# Patient Record
Sex: Female | Born: 1935 | Race: White | Hispanic: No | Smoking: Never smoker
Health system: Southern US, Community
[De-identification: ages and names within clinical notes are randomized; demographics above are authoritative.]

## PROBLEM LIST (undated history)

## (undated) DIAGNOSIS — I10 Essential (primary) hypertension: Secondary | ICD-10-CM

## (undated) DIAGNOSIS — E119 Type 2 diabetes mellitus without complications: Secondary | ICD-10-CM

## (undated) DIAGNOSIS — G2581 Restless legs syndrome: Secondary | ICD-10-CM

## (undated) DIAGNOSIS — E785 Hyperlipidemia, unspecified: Secondary | ICD-10-CM

## (undated) DIAGNOSIS — Z8719 Personal history of other diseases of the digestive system: Secondary | ICD-10-CM

## (undated) DIAGNOSIS — F329 Major depressive disorder, single episode, unspecified: Secondary | ICD-10-CM

## (undated) DIAGNOSIS — I4891 Unspecified atrial fibrillation: Secondary | ICD-10-CM

## (undated) DIAGNOSIS — J45909 Unspecified asthma, uncomplicated: Secondary | ICD-10-CM

## (undated) DIAGNOSIS — M1990 Unspecified osteoarthritis, unspecified site: Secondary | ICD-10-CM

## (undated) DIAGNOSIS — F32A Depression, unspecified: Secondary | ICD-10-CM

## (undated) DIAGNOSIS — I499 Cardiac arrhythmia, unspecified: Secondary | ICD-10-CM

## (undated) HISTORY — PX: SHOULDER SURGERY: SHX246

## (undated) HISTORY — PX: KNEE ARTHROSCOPY: SUR90

## (undated) HISTORY — PX: BACK SURGERY: SHX140

---

## 2002-04-04 ENCOUNTER — Ambulatory Visit (HOSPITAL_COMMUNITY): Admission: RE | Admit: 2002-04-04 | Discharge: 2002-04-04 | Payer: Self-pay | Admitting: Neurosurgery

## 2002-04-04 ENCOUNTER — Encounter: Payer: Self-pay | Admitting: Neurosurgery

## 2011-05-10 DIAGNOSIS — E119 Type 2 diabetes mellitus without complications: Secondary | ICD-10-CM

## 2014-02-07 DIAGNOSIS — M48062 Spinal stenosis, lumbar region with neurogenic claudication: Secondary | ICD-10-CM | POA: Insufficient documentation

## 2014-02-07 DIAGNOSIS — K219 Gastro-esophageal reflux disease without esophagitis: Secondary | ICD-10-CM | POA: Insufficient documentation

## 2014-02-07 DIAGNOSIS — M5136 Other intervertebral disc degeneration, lumbar region: Secondary | ICD-10-CM | POA: Insufficient documentation

## 2014-02-07 DIAGNOSIS — E785 Hyperlipidemia, unspecified: Secondary | ICD-10-CM | POA: Insufficient documentation

## 2014-02-07 DIAGNOSIS — I10 Essential (primary) hypertension: Secondary | ICD-10-CM | POA: Insufficient documentation

## 2014-09-28 DIAGNOSIS — M069 Rheumatoid arthritis, unspecified: Secondary | ICD-10-CM | POA: Insufficient documentation

## 2015-03-26 DIAGNOSIS — I4891 Unspecified atrial fibrillation: Secondary | ICD-10-CM | POA: Insufficient documentation

## 2015-04-21 DIAGNOSIS — H353212 Exudative age-related macular degeneration, right eye, with inactive choroidal neovascularization: Secondary | ICD-10-CM | POA: Insufficient documentation

## 2015-06-08 DIAGNOSIS — G478 Other sleep disorders: Secondary | ICD-10-CM | POA: Insufficient documentation

## 2015-06-08 DIAGNOSIS — G4733 Obstructive sleep apnea (adult) (pediatric): Secondary | ICD-10-CM

## 2015-06-08 DIAGNOSIS — J4520 Mild intermittent asthma, uncomplicated: Secondary | ICD-10-CM | POA: Insufficient documentation

## 2015-06-08 DIAGNOSIS — Z9989 Dependence on other enabling machines and devices: Secondary | ICD-10-CM

## 2015-06-08 DIAGNOSIS — I2729 Other secondary pulmonary hypertension: Secondary | ICD-10-CM

## 2015-07-11 ENCOUNTER — Emergency Department (HOSPITAL_BASED_OUTPATIENT_CLINIC_OR_DEPARTMENT_OTHER): Payer: Medicare Other

## 2015-07-11 ENCOUNTER — Encounter (HOSPITAL_BASED_OUTPATIENT_CLINIC_OR_DEPARTMENT_OTHER): Payer: Self-pay | Admitting: Emergency Medicine

## 2015-07-11 ENCOUNTER — Emergency Department (HOSPITAL_BASED_OUTPATIENT_CLINIC_OR_DEPARTMENT_OTHER)
Admission: EM | Admit: 2015-07-11 | Discharge: 2015-07-11 | Disposition: A | Discharge status: Home / Self Care | Payer: Medicare Other | Attending: Emergency Medicine | Admitting: Emergency Medicine

## 2015-07-11 DIAGNOSIS — I10 Essential (primary) hypertension: Secondary | ICD-10-CM | POA: Insufficient documentation

## 2015-07-11 DIAGNOSIS — Y939 Activity, unspecified: Secondary | ICD-10-CM | POA: Diagnosis not present

## 2015-07-11 DIAGNOSIS — M25512 Pain in left shoulder: Secondary | ICD-10-CM

## 2015-07-11 DIAGNOSIS — W109XXA Fall (on) (from) unspecified stairs and steps, initial encounter: Secondary | ICD-10-CM | POA: Diagnosis not present

## 2015-07-11 DIAGNOSIS — M6281 Muscle weakness (generalized): Secondary | ICD-10-CM | POA: Diagnosis not present

## 2015-07-11 DIAGNOSIS — Y92009 Unspecified place in unspecified non-institutional (private) residence as the place of occurrence of the external cause: Secondary | ICD-10-CM | POA: Diagnosis not present

## 2015-07-11 DIAGNOSIS — I4891 Unspecified atrial fibrillation: Secondary | ICD-10-CM | POA: Insufficient documentation

## 2015-07-11 DIAGNOSIS — Y999 Unspecified external cause status: Secondary | ICD-10-CM | POA: Diagnosis not present

## 2015-07-11 DIAGNOSIS — G8929 Other chronic pain: Secondary | ICD-10-CM | POA: Insufficient documentation

## 2015-07-11 HISTORY — DX: Unspecified atrial fibrillation: I48.91

## 2015-07-11 HISTORY — DX: Essential (primary) hypertension: I10

## 2015-07-11 LAB — PROTIME-INR
INR: 3.01 — ABNORMAL HIGH (ref 0.00–1.49)
Prothrombin Time: 30.7 seconds — ABNORMAL HIGH (ref 11.6–15.2)

## 2015-07-11 NOTE — ED Provider Notes (Signed)
CSN: [id number]     Arrival date & time 07/11/15  1844 History  By signing my name below, I, Bethel Born, attest that this documentation has been prepared under the direction and in the presence of Melene Plan, DO. Electronically Signed: Bethel Born, ED Scribe. 07/11/2015. 9:34 PM   Chief Complaint  Patient presents with  . Fall   The history is provided by the patient. No language interpreter was used.   Quandra Fedorchak is a 82 y.o. female with history of a-fib on coumadin and HTN who presents to the Emergency Department for evaluation after a fall today at home. The pt states that her legs are weak and gave out on the steps of her deck at home. Her legs are chronically weak with no recent change. She landed on the left shoulder and struck her head with no LOC. Associated symptoms include constant, 5/10 in severity,  left shoulder pain and a small abrasion at the right elbow.  Pt denies neck pain, abdominal pain, pelvic pain, back pain, or LE pain. Last tetanus is unknown. Her INR was last check 2 weeks ago and was 3 at that time.   Past Medical History  Diagnosis Date  . Hypertension   . Atrial fibrillation [facility])    Past Surgical History  Procedure Laterality Date  . Back surgery    . Shoulder surgery    . Knee arthroscopy     History reviewed. No pertinent family history. Social History  Substance Use Topics  . Smoking status: Never Smoker   . Smokeless tobacco: None  . Alcohol Use: No   OB History    No data available     Review of Systems  Musculoskeletal: Positive for arthralgias (left shoulder).  Skin: Positive for wound.  Neurological: Negative for syncope.  All other systems reviewed and are negative.  Allergies  Review of patient's allergies indicates no known allergies.  Home Medications   Prior to Admission medications   Not on File   BP 176/66 mmHg  Pulse 62  Temp(Src) 98.7 F (37.1 C) (Oral)  Resp 19  SpO2 98% Physical Exam  Constitutional: She  is oriented to person, place, and time. She appears well-developed and well-nourished. No distress.  HENT:  Head: Normocephalic and atraumatic.  No signs of trauma to the head   Eyes: EOM are normal. Pupils are equal, round, and reactive to light.  Neck: Normal range of motion. Neck supple.  Cardiovascular: Normal rate and regular rhythm.  Exam reveals no gallop and no friction rub.   No murmur heard. Pulmonary/Chest: Effort normal. She has no wheezes. She has no rales. She exhibits no tenderness.  Abdominal: Soft. She exhibits no distension. There is no tenderness.  Musculoskeletal: She exhibits tenderness. She exhibits no edema.  LUE: Some mild tenderness of supraspinatus and teres minor Pulse, motor, and sensation intact in hand No midline spinal tenderness No tenderness to the lower extremities Grossly NVI  Neurological: She is alert and oriented to person, place, and time.  Skin: Skin is warm and dry. She is not diaphoretic.  Psychiatric: She has a normal mood and affect. Her behavior is normal.  Nursing note and vitals reviewed.   ED Course  Procedures (including critical care time) DIAGNOSTIC STUDIES: Oxygen Saturation is 98% on RA,  normal by my interpretation.    COORDINATION OF CARE: 7:57 PM Discussed treatment plan which includes left shoulder XR, CT head, and lab work with pt at bedside and pt agreed to plan.  Labs Review Labs Reviewed  PROTIME-INR - Abnormal; Notable for the following:    Prothrombin Time 30.7 (*)    INR 3.01 (*)    All other components within normal limits    Imaging Review Ct Head Wo Contrast  07/11/2015  CLINICAL DATA:  Initial evaluation for acute trauma, fall. EXAM: CT HEAD WITHOUT CONTRAST TECHNIQUE: Contiguous axial images were obtained from the base of the skull through the vertex without intravenous contrast. COMPARISON:  None. FINDINGS: Age-related cerebral atrophy with moderate chronic small vessel ischemic disease present. Scattered  vascular calcifications within the carotid siphons. No acute intracranial hemorrhage. No acute large vessel territory infarct. No mass lesion, midline shift, or mass effect. No hydrocephalus. No extra-axial fluid collection. Scalp soft tissues within normal limits. No acute abnormality about the orbits. Paranasal sinuses are clear.  No mastoid effusion. Calvarium intact. IMPRESSION: 1. No acute intracranial process identified. 2. Age-related cerebral atrophy with moderate chronic small vessel ischemic disease. Electronically Signed   By: Rise Mu M.D.   On: 07/11/2015 21:29   Dg Shoulder Left  07/11/2015  CLINICAL DATA:  Fall on the left side with shoulder pain, initial encounter EXAM: LEFT SHOULDER - 2+ VIEW COMPARISON:  None. FINDINGS: Degenerative changes of the acromioclavicular joint are noted. No acute fracture is seen. No gross soft tissue abnormality is noted. IMPRESSION: No acute abnormality noted. Electronically Signed   By: Alcide Clever M.D.   On: 07/11/2015 19:45   I have personally reviewed and evaluated these images and lab results as part of my medical decision-making.   EKG Interpretation None      MDM   Final diagnoses:  Left shoulder pain    82 yo F With a chief complaint of fall. Patient states that she has chronic leg weakness and felt that make her fall forward. She landed on her left shoulder. Having pain mostly to the anterior portion of the shoulder. Unable to fully assess the sits muscles due to pain. We'll place in a sling. PCP follow-up. CT  head negative INR slightly supratherapeutic we'll have her hold 1 dose of Coumadin.  I personally performed the services described in this documentation, which was scribed in my presence. The recorded information has been reviewed and is accurate.   9:37 PM:  I have discussed the diagnosis/risks/treatment options with the patient and family and believe the pt to be eligible for discharge home to follow-up with PCP. We  also discussed returning to the ED immediately if new or worsening sx occur. We discussed the sx which are most concerning (e.g., sudden worsening pain, fever, inability to tolerate by mouth) that necessitate immediate return. Medications administered to the patient during their visit and any new prescriptions provided to the patient are listed below.  Medications given during this visit Medications - No data to display  New Prescriptions   No medications on file    The patient appears reasonably screen and/or stabilized for discharge and I doubt any other medical condition or other [facility] requiring further screening, evaluation, or treatment in the ED at this time prior to discharge.     Melene Plan, DO 07/11/15 2137

## 2015-07-11 NOTE — ED Notes (Signed)
Pt in c/o L shoulder pain after falling on the steps today. States struck her head lightly and does take Coumadin. Alert, interactive, neuro intact at this time.

## 2015-07-11 NOTE — Discharge Instructions (Signed)
Shoulder Pain The shoulder is the joint that connects your arm to your body. Muscles and band-like tissues that connect bones to muscles (tendons) hold the joint together. Shoulder pain is felt if an injury or medical problem affects one or more parts of the shoulder. HOME CARE   Put ice on the sore area.  Put ice in a plastic bag.  Place a towel between your skin and the bag.  Leave the ice on for 15-20 minutes, 03-04 times a day for the first 2 days.  Stop using cold packs if they do not help with the pain.  If you were given something to keep your shoulder from moving (sling; shoulder immobilizer), wear it as told. Only take it off to shower or bathe.  Move your arm as little as possible, but keep your hand moving to prevent puffiness (swelling).  Squeeze a soft ball or foam pad as much as possible to help prevent swelling.  Take medicine as told by your doctor. GET HELP IF:  You have progressing new pain in your arm, hand, or fingers.  Your hand or fingers get cold.  Your medicine does not help lessen your pain. GET HELP RIGHT AWAY IF:   Your arm, hand, or fingers are numb or tingling.  Your arm, hand, or fingers are puffy (swollen), painful, or turn white or blue. MAKE SURE YOU:   Understand these instructions.  Will watch your condition.  Will get help right away if you are not doing well or get worse.   This information is not intended to replace advice given to you by your health care provider. Make sure you discuss any questions you have with your health care provider.   Document Released: 08/02/2007 Document Revised: 03/06/2014 Document Reviewed: 06/08/2014 Elsevier Interactive Patient Education 2016 [employer]  

## 2015-07-30 DIAGNOSIS — Z7901 Long term (current) use of anticoagulants: Secondary | ICD-10-CM

## 2015-09-22 DIAGNOSIS — M1712 Unilateral primary osteoarthritis, left knee: Secondary | ICD-10-CM | POA: Insufficient documentation

## 2015-12-08 DIAGNOSIS — M1711 Unilateral primary osteoarthritis, right knee: Secondary | ICD-10-CM | POA: Insufficient documentation

## 2016-01-13 DIAGNOSIS — Z6841 Body Mass Index (BMI) 40.0 and over, adult: Secondary | ICD-10-CM

## 2016-05-22 DIAGNOSIS — I480 Paroxysmal atrial fibrillation: Secondary | ICD-10-CM | POA: Diagnosis present

## 2016-10-28 HISTORY — PX: HERNIA REPAIR: SHX51

## 2016-11-01 ENCOUNTER — Emergency Department (HOSPITAL_COMMUNITY): Payer: Medicare Other

## 2016-11-01 ENCOUNTER — Encounter (HOSPITAL_COMMUNITY): Payer: Self-pay | Admitting: Emergency Medicine

## 2016-11-01 ENCOUNTER — Observation Stay (HOSPITAL_COMMUNITY)
Admission: EM | Admit: 2016-11-01 | Discharge: 2016-11-02 | Disposition: A | Discharge status: Home / Self Care | Payer: Medicare Other | Attending: Interventional Cardiology | Admitting: Interventional Cardiology

## 2016-11-01 DIAGNOSIS — Z9989 Dependence on other enabling machines and devices: Secondary | ICD-10-CM | POA: Diagnosis not present

## 2016-11-01 DIAGNOSIS — I10 Essential (primary) hypertension: Secondary | ICD-10-CM | POA: Insufficient documentation

## 2016-11-01 DIAGNOSIS — G4733 Obstructive sleep apnea (adult) (pediatric): Secondary | ICD-10-CM | POA: Diagnosis not present

## 2016-11-01 DIAGNOSIS — Z7901 Long term (current) use of anticoagulants: Secondary | ICD-10-CM

## 2016-11-01 DIAGNOSIS — Z6841 Body Mass Index (BMI) 40.0 and over, adult: Secondary | ICD-10-CM | POA: Diagnosis not present

## 2016-11-01 DIAGNOSIS — Z79899 Other long term (current) drug therapy: Secondary | ICD-10-CM | POA: Diagnosis not present

## 2016-11-01 DIAGNOSIS — I480 Paroxysmal atrial fibrillation: Secondary | ICD-10-CM | POA: Diagnosis not present

## 2016-11-01 DIAGNOSIS — I4891 Unspecified atrial fibrillation: Principal | ICD-10-CM | POA: Insufficient documentation

## 2016-11-01 DIAGNOSIS — R079 Chest pain, unspecified: Secondary | ICD-10-CM | POA: Diagnosis present

## 2016-11-01 DIAGNOSIS — E1140 Type 2 diabetes mellitus with diabetic neuropathy, unspecified: Secondary | ICD-10-CM | POA: Diagnosis not present

## 2016-11-01 DIAGNOSIS — E119 Type 2 diabetes mellitus without complications: Secondary | ICD-10-CM

## 2016-11-01 HISTORY — DX: Depression, unspecified: F32.A

## 2016-11-01 HISTORY — DX: Unspecified osteoarthritis, unspecified site: M19.90

## 2016-11-01 HISTORY — DX: Type 2 diabetes mellitus without complications: E11.9

## 2016-11-01 HISTORY — DX: Hyperlipidemia, unspecified: E78.5

## 2016-11-01 HISTORY — DX: Cardiac arrhythmia, unspecified: I49.9

## 2016-11-01 HISTORY — DX: Restless legs syndrome: G25.81

## 2016-11-01 HISTORY — DX: Unspecified asthma, uncomplicated: J45.909

## 2016-11-01 HISTORY — DX: Major depressive disorder, single episode, unspecified: F32.9

## 2016-11-01 LAB — CBC
HEMATOCRIT: 44.4 % (ref 36.0–46.0)
Hemoglobin: 14.5 g/dL (ref 12.0–15.0)
MCH: 30.1 pg (ref 26.0–34.0)
MCHC: 32.7 g/dL (ref 30.0–36.0)
MCV: 92.1 fL (ref 78.0–100.0)
PLATELETS: 247 10*3/uL (ref 150–400)
RBC: 4.82 MIL/uL (ref 3.87–5.11)
RDW: 15 % (ref 11.5–15.5)
WBC: 8.7 10*3/uL (ref 4.0–10.5)

## 2016-11-01 LAB — CBC WITH DIFFERENTIAL/PLATELET
BASOS PCT: 0 %
Basophils Absolute: 0 10*3/uL (ref 0.0–0.1)
EOS ABS: 0.2 10*3/uL (ref 0.0–0.7)
EOS PCT: 3 %
HCT: 47.4 % — ABNORMAL HIGH (ref 36.0–46.0)
HEMOGLOBIN: 15.2 g/dL — AB (ref 12.0–15.0)
Lymphocytes Relative: 15 %
Lymphs Abs: 1.3 10*3/uL (ref 0.7–4.0)
MCH: 29.5 pg (ref 26.0–34.0)
MCHC: 32.1 g/dL (ref 30.0–36.0)
MCV: 91.9 fL (ref 78.0–100.0)
Monocytes Absolute: 1.2 10*3/uL — ABNORMAL HIGH (ref 0.1–1.0)
Monocytes Relative: 13 %
NEUTROS PCT: 69 %
Neutro Abs: 5.9 10*3/uL (ref 1.7–7.7)
PLATELETS: 235 10*3/uL (ref 150–400)
RBC: 5.16 MIL/uL — AB (ref 3.87–5.11)
RDW: 15 % (ref 11.5–15.5)
WBC: 8.7 10*3/uL (ref 4.0–10.5)

## 2016-11-01 LAB — COMPREHENSIVE METABOLIC PANEL
ALBUMIN: 3.6 g/dL (ref 3.5–5.0)
ALK PHOS: 80 U/L (ref 38–126)
ALT: 22 U/L (ref 14–54)
ANION GAP: 13 (ref 5–15)
AST: 29 U/L (ref 15–41)
BILIRUBIN TOTAL: 1.2 mg/dL (ref 0.3–1.2)
BUN: 15 mg/dL (ref 6–20)
CHLORIDE: 107 mmol/L (ref 101–111)
CO2: 20 mmol/L — AB (ref 22–32)
Calcium: 8.8 mg/dL — ABNORMAL LOW (ref 8.9–10.3)
Creatinine, Ser: 0.88 mg/dL (ref 0.44–1.00)
GFR calc Af Amer: >60 mL/min (ref >60)
GFR calc non Af Amer: 59 mL/min — ABNORMAL LOW (ref >60)
GLUCOSE: 123 mg/dL — AB (ref 65–99)
POTASSIUM: 4 mmol/L (ref 3.5–5.1)
Sodium: 140 mmol/L (ref 135–145)
Total Protein: 7.3 g/dL (ref 6.5–8.1)

## 2016-11-01 LAB — BASIC METABOLIC PANEL
ANION GAP: 9 (ref 5–15)
BUN: 20 mg/dL (ref 6–20)
CALCIUM: 8.5 mg/dL — AB (ref 8.9–10.3)
CHLORIDE: 108 mmol/L (ref 101–111)
CO2: 22 mmol/L (ref 22–32)
CREATININE: 0.99 mg/dL (ref 0.44–1.00)
GFR calc Af Amer: 59 mL/min — ABNORMAL LOW (ref >60)
GFR calc non Af Amer: 51 mL/min — ABNORMAL LOW (ref >60)
Glucose, Bld: 123 mg/dL — ABNORMAL HIGH (ref 65–99)
Potassium: 3.7 mmol/L (ref 3.5–5.1)
Sodium: 139 mmol/L (ref 135–145)

## 2016-11-01 LAB — MAGNESIUM
MAGNESIUM: 2.3 mg/dL (ref 1.7–2.4)
Magnesium: 2.1 mg/dL (ref 1.7–2.4)

## 2016-11-01 LAB — PROTIME-INR
INR: 1.68
Prothrombin Time: 19.6 seconds — ABNORMAL HIGH (ref 11.4–15.2)

## 2016-11-01 LAB — HEMOGLOBIN A1C
HEMOGLOBIN A1C: 6.1 % — AB (ref 4.8–5.6)
MEAN PLASMA GLUCOSE: 128.37 mg/dL

## 2016-11-01 LAB — CBG MONITORING, ED
Glucose-Capillary: 115 mg/dL — ABNORMAL HIGH (ref 65–99)
Glucose-Capillary: 143 mg/dL — ABNORMAL HIGH (ref 65–99)

## 2016-11-01 LAB — TSH: TSH: 1.066 u[IU]/mL (ref 0.350–4.500)

## 2016-11-01 LAB — TROPONIN I
TROPONIN I: 0.03 ng/mL — AB (ref <0.03)
TROPONIN I: 0.03 ng/mL — AB (ref <0.03)

## 2016-11-01 LAB — GLUCOSE, CAPILLARY
Glucose-Capillary: 107 mg/dL — ABNORMAL HIGH (ref 65–99)
Glucose-Capillary: 141 mg/dL — ABNORMAL HIGH (ref 65–99)

## 2016-11-01 LAB — BRAIN NATRIURETIC PEPTIDE: B Natriuretic Peptide: 322.2 pg/mL — ABNORMAL HIGH (ref 0.0–100.0)

## 2016-11-01 MED ORDER — FOLIC ACID 1 MG PO TABS
1.0000 mg | ORAL_TABLET | Freq: Every day | ORAL | Status: DC
  Administered 2016-11-01 – 2016-11-02 (×2): 1 mg via ORAL
  Filled 2016-11-01 (×2): qty 1

## 2016-11-01 MED ORDER — MOMETASONE FURO-FORMOTEROL FUM 100-5 MCG/ACT IN AERO
2.0000 | INHALATION_SPRAY | Freq: Two times a day (BID) | RESPIRATORY_TRACT | Status: DC
  Administered 2016-11-01 – 2016-11-02 (×2): 2 via RESPIRATORY_TRACT
  Filled 2016-11-01: qty 8.8

## 2016-11-01 MED ORDER — AMIODARONE HCL IN DEXTROSE 360-4.14 MG/200ML-% IV SOLN: 30.0000 mg/h | INTRAVENOUS | Status: DC

## 2016-11-01 MED ORDER — DILTIAZEM HCL 100 MG IV SOLR
5.0000 mg/h | INTRAVENOUS | Status: DC
  Administered 2016-11-01 (×2): 5 mg/h via INTRAVENOUS
  Filled 2016-11-01 (×2): qty 100

## 2016-11-01 MED ORDER — ALBUTEROL SULFATE (2.5 MG/3ML) 0.083% IN NEBU: 2.5000 mg | INHALATION_SOLUTION | Freq: Four times a day (QID) | RESPIRATORY_TRACT | Status: DC | PRN

## 2016-11-01 MED ORDER — ATENOLOL 25 MG PO TABS
100.0000 mg | ORAL_TABLET | Freq: Every day | ORAL | Status: DC
  Administered 2016-11-01 – 2016-11-02 (×2): 100 mg via ORAL
  Filled 2016-11-01: qty 4
  Filled 2016-11-01: qty 2

## 2016-11-01 MED ORDER — METOPROLOL TARTRATE 5 MG/5ML IV SOLN: 5.0000 mg | INTRAVENOUS | Status: DC | PRN

## 2016-11-01 MED ORDER — ONDANSETRON HCL 4 MG/2ML IJ SOLN: 4.0000 mg | Freq: Once | INTRAMUSCULAR | Status: DC

## 2016-11-01 MED ORDER — WARFARIN SODIUM 4 MG PO TABS
4.0000 mg | ORAL_TABLET | Freq: Once | ORAL | Status: AC
  Administered 2016-11-01: 4 mg via ORAL
  Filled 2016-11-01: qty 1

## 2016-11-01 MED ORDER — ROPINIROLE HCL 0.5 MG PO TABS
0.5000 mg | ORAL_TABLET | Freq: Every day | ORAL | Status: DC
  Administered 2016-11-01: 0.5 mg via ORAL
  Filled 2016-11-01: qty 1

## 2016-11-01 MED ORDER — AMIODARONE LOAD VIA INFUSION
150.0000 mg | Freq: Once | INTRAVENOUS | Status: DC
  Filled 2016-11-01: qty 83.34

## 2016-11-01 MED ORDER — ATORVASTATIN CALCIUM 20 MG PO TABS
20.0000 mg | ORAL_TABLET | Freq: Every evening | ORAL | Status: DC
  Administered 2016-11-01: 20 mg via ORAL
  Filled 2016-11-01: qty 1

## 2016-11-01 MED ORDER — PANTOPRAZOLE SODIUM 40 MG PO TBEC
40.0000 mg | DELAYED_RELEASE_TABLET | Freq: Every day | ORAL | Status: DC
  Administered 2016-11-01 – 2016-11-02 (×2): 40 mg via ORAL
  Filled 2016-11-01 (×2): qty 1

## 2016-11-01 MED ORDER — ONDANSETRON HCL 4 MG/2ML IJ SOLN: 4.0000 mg | Freq: Four times a day (QID) | INTRAMUSCULAR | Status: DC | PRN

## 2016-11-01 MED ORDER — AMIODARONE HCL IN DEXTROSE 360-4.14 MG/200ML-% IV SOLN
60.0000 mg/h | INTRAVENOUS | Status: DC
Start: 2016-11-01 — End: 2016-11-01
  Filled 2016-11-01: qty 200

## 2016-11-01 MED ORDER — BUDESONIDE-FORMOTEROL FUMARATE 80-4.5 MCG/ACT IN AERO
2.0000 | INHALATION_SPRAY | Freq: Two times a day (BID) | RESPIRATORY_TRACT | Status: DC
  Filled 2016-11-01: qty 6.9

## 2016-11-01 MED ORDER — ACETAMINOPHEN 325 MG PO TABS: 650.0000 mg | ORAL_TABLET | ORAL | Status: DC | PRN

## 2016-11-01 MED ORDER — MORPHINE SULFATE (PF) 4 MG/ML IV SOLN: 4.0000 mg | Freq: Once | INTRAVENOUS | Status: DC

## 2016-11-01 MED ORDER — WARFARIN - PHARMACIST DOSING INPATIENT: Freq: Every day | Status: DC

## 2016-11-01 MED ORDER — ALBUTEROL SULFATE HFA 108 (90 BASE) MCG/ACT IN AERS: 1.0000 | INHALATION_SPRAY | Freq: Four times a day (QID) | RESPIRATORY_TRACT | Status: DC | PRN

## 2016-11-01 MED ORDER — METHOTREXATE SODIUM CHEMO INJECTION 50 MG/2ML: 10.0000 mg | INTRAMUSCULAR | Status: DC

## 2016-11-01 MED ORDER — INSULIN ASPART 100 UNIT/ML ~~LOC~~ SOLN
0.0000 [IU] | Freq: Three times a day (TID) | SUBCUTANEOUS | Status: DC
Start: 2016-11-01 — End: 2016-11-02
  Administered 2016-11-01 – 2016-11-02 (×2): 1 [IU] via SUBCUTANEOUS
  Filled 2016-11-01: qty 1

## 2016-11-01 MED ORDER — PREDNISONE 5 MG PO TABS
5.0000 mg | ORAL_TABLET | Freq: Every day | ORAL | Status: DC
  Administered 2016-11-02: 5 mg via ORAL
  Filled 2016-11-01: qty 1

## 2016-11-01 MED ORDER — DILTIAZEM LOAD VIA INFUSION
20.0000 mg | Freq: Once | INTRAVENOUS | Status: AC
  Administered 2016-11-01: 20 mg via INTRAVENOUS
  Filled 2016-11-01: qty 20

## 2016-11-01 NOTE — Progress Notes (Signed)
ANTICOAGULATION CONSULT NOTE - Initial Consult  Pharmacy Consult for Coumadin  Indication: atrial fibrillation  Allergies  Allergen Reactions  . Other Itching and Rash    Antibiotic but she is not sure which one.  Maybe starts with a D for UTI     Patient Measurements: Height: 5\' 2"  (157.5 cm) Weight: 243 lb (110.2 kg) IBW/kg (Calculated) : 50.1 Heparin Dosing Weight: n/a   Vital Signs: Temp: 97.7 F (36.5 C) (09/05 1700) Temp Source: Oral (09/05 1700) BP: 152/98 (09/05 1545) Pulse Rate: 64 (09/05 1630)  Labs:  Recent Labs  11/01/16 0530 11/01/16 1408  HGB 14.5 15.2*  HCT 44.4 47.4*  PLT 247 235  LABPROT  --  19.6*  INR  --  1.68  CREATININE 0.99 0.88  TROPONINI  --  0.03*    Estimated Creatinine Clearance: 55.7 mL/min (by C-G formula based on SCr of 0.88 mg/dL).   Medical History: Past Medical History:  Diagnosis Date  . Arrhythmia   . Arthritis   . Asthma   . Atrial fibrillation (HCC)   . Depression   . Diabetes mellitus without complication (HCC)   . Hyperlipidemia   . Hypertension   . RLS (restless legs syndrome)     Medications:  Prescriptions Prior to Admission  Medication Sig Dispense Refill Last Dose  . albuterol (PROVENTIL HFA;VENTOLIN HFA) 108 (90 Base) MCG/ACT inhaler Inhale 1-2 puffs into the lungs every 6 (six) hours as needed for wheezing or shortness of breath.   unk  . atorvastatin (LIPITOR) 20 MG tablet Take 20 mg by mouth every evening.   10/31/2016 at Unknown time  . Budesonide-Formoterol Fumarate (SYMBICORT IN) Inhale 1-2 puffs into the lungs daily as needed (shortness of breath).   unk  . diltiazem (CARDIZEM) 30 MG tablet Take 30 mg by mouth 2 (two) times daily.   10/31/2016 at Unknown time  . folic acid (FOLVITE) 1 MG tablet Take 1 mg by mouth daily.   10/31/2016 at Unknown time  . [START ON 11/04/2016] methotrexate 50 MG/2ML injection Inject 10 mg into the skin once a week.   10/28/2016  . omeprazole (PRILOSEC) 20 MG capsule Take 20 mg by  mouth daily.   10/31/2016 at Unknown time  . rOPINIRole (REQUIP) 0.25 MG tablet Take 0.5 mg by mouth at bedtime.   10/31/2016 at Unknown time  . warfarin (COUMADIN) 2.5 MG tablet Take 2.5 mg by mouth daily.   10/31/2016 at 2400    Assessment: 44 YOF with h/o pAFib with RVR here with palpitations, weakness and dyspnea. Pharmacy consulted to resume home Coumadin for AFib. INR on admission is sub-therapeutic at 1.68. H/H 15.2/47.2. Plt wnl.   Home Coumadin dose: 2.5 mg daily. Last dose was yesterday.   Goal of Therapy:  INR 2-3 Monitor platelets by anticoagulation protocol: Yes   Plan:  -Give a boost dose of warfarin 4 mg x 1 today -Monitor daily PT/INR and s/s of bleeding  05-04-2000, PharmD., BCPS Clinical Pharmacist Pager [phone]

## 2016-11-01 NOTE — ED Notes (Signed)
Attempted to start an IV x 1 unsuccessfully

## 2016-11-01 NOTE — H&P (Addendum)
Cardiology Admission History and Physical:   Patient ID: Michelle Douglas; MRN: [id number]; DOB: [birth year]   Admission date: 11/01/2016  Primary Care Provider: Ardyth Gal, MD Primary Cardiologist: Sandy Salaam Primary Electrophysiologist:  None  Chief Complaint:  AF with RVR  Patient Profile:   Michelle Douglas is a 83 y.o. female with a history of Paroxysmal atrial fibrillation with rapid ventricular response and other medical problems including obstructive sleep apnea, diabetes mellitus type 2, hyperlipidemia, hypertension, chronic Coumadin anticoagulation therapy, degenerative arthritis, rheumatoid arthritis, chronic steroid use, hiatal hernia, restless leg syndrome, and depression. The current episode of atrial fibrillation started at around 3 AM.  History of Present Illness:   Michelle Douglas was unable to sleep last night and while up at around 3 AM developed palpitations, weakness, and dyspnea. She has a history of paroxysmal atrial fibrillation and the symptoms were compatible with her prior presentations. She did not have syncope. She is asymptomatic when sitting or lying. She was brought to the [facility] emergency room because they were full at [facility]. She is managed by Dr. Sandy Salaam in [facility]. As best I can tell, cardiac medications including atenolol, diltiazem, and Coumadin. There is no history of stroke. History of atrial fibrillation and chronic Coumadin therapy date back approximately 3 years.   Past Medical History:  Diagnosis Date  . Arrhythmia   . Arthritis   . Asthma   . Atrial fibrillation (HCC)   . Depression   . Diabetes mellitus without complication (HCC)   . Hyperlipidemia   . Hypertension   . RLS (restless legs syndrome)     Past Surgical History:  Procedure Laterality Date  . BACK SURGERY    . KNEE ARTHROSCOPY    . SHOULDER SURGERY       Medications Prior to Admission: Prior to Admission medications   Medication Sig Start  Date End Date Taking? Authorizing Provider  albuterol (PROVENTIL HFA;VENTOLIN HFA) 108 (90 Base) MCG/ACT inhaler Inhale 1-2 puffs into the lungs every 6 (six) hours as needed for wheezing or shortness of breath.   Yes [provider]  atorvastatin (LIPITOR) 20 MG tablet Take 20 mg by mouth every evening.   Yes [provider]  Budesonide-Formoterol Fumarate (SYMBICORT IN) Inhale 1-2 puffs into the lungs daily as needed (shortness of breath).   Yes [provider]  diltiazem (CARDIZEM) 30 MG tablet Take 30 mg by mouth 2 (two) times daily.   Yes [provider]  folic acid (FOLVITE) 1 MG tablet Take 1 mg by mouth daily.   Yes [provider]  methotrexate (50 MG/ML) 1 g injection Inject 2 mg into the vein once a week. saturday   Yes [provider]  omeprazole (PRILOSEC) 20 MG capsule Take 20 mg by mouth daily.   Yes [provider]  rOPINIRole (REQUIP) 0.25 MG tablet Take 0.5 mg by mouth at bedtime.   Yes [provider]  warfarin (COUMADIN) 2.5 MG tablet Take 2.5 mg by mouth daily.   Yes [provider]     Allergies:    Allergies  Allergen Reactions  . Other Itching and Rash    Antibiotic but she is not sure which one.  Maybe starts with a D for UTI     Social History:   Social History   Social History  . Marital status: Married    Spouse name: N/A  . Number of children: N/A  . Years of education: N/A   Occupational  History  . Not on file.   Social History Main Topics  . Smoking status: Never Smoker  . Smokeless tobacco: Never Used  . Alcohol use No  . Drug use: No  . Sexual activity: No   Other Topics Concern  . Not on file   Social History Narrative  . No narrative on file    Family History:  The patient's mother and father are deceased. There is no history relevant to her current presenting complaints. The patient's family history is not on file.    ROS:  Please see the history of  present illness.  Severe insomnia with restless leg syndrome. Unable to sleep most nights. Chronic back discomfort and leg discomfort with walking. Significant reflux related symptoms with upcoming surgery for hiatal hernia being planned. All other ROS reviewed and negative.     Physical Exam/Data:   Vitals:   11/01/16 0730 11/01/16 0745 11/01/16 0800 11/01/16 0815  BP: 122/78 119/81 115/71 136/74  Pulse: 67 97 76 95  Resp: (!) 21 20 19 18   Temp:      TempSrc:      SpO2: 95% 98% 94% 95%   No intake or output data in the 24 hours ending 11/01/16 0830 There were no vitals filed for this visit. There is no height or weight on file to calculate BMI.  General:  Well nourished, well developed, in no acute distress. Elderly. HEENT: normal Lymph: no adenopathy Neck: no JVD Endocrine:  No thryomegaly Vascular: No carotid bruits; FA pulses 2+ bilaterally without bruits  Cardiac:  normal S1, S2; IIRR; no murmur or gallop. Lungs:  clear to auscultation bilaterally, no wheezing, rhonchi or rales  Abd: soft, nontender, no hepatomegaly  Ext: no edema Musculoskeletal:  No deformities, BUE and BLE strength normal and equal Skin: warm and dry  Neuro:  CNs 2-12 intact, no focal abnormalities noted Psych:  Normal affect    EKG:  The ECG that was done 11/01/2016 was personally reviewed and demonstrates atrial fibrillation with rapid ventricular response at 126 bpm without acute ST-T wave change. Leftward axis. No old tracing to compare.  Relevant CV Studies: Echocardiogram 01/03/2016 in [facility] Conclusions Summary Mild concentric left ventricular hypertrophy Normal left ventricular size and systolic function with no appreciable segmental abnormality. Ejection fraction is visually estimated at 55-60% Mild tricuspid regurgitation RVSP 30 mm Hg. Laboratory Data:  Chemistry  Recent Labs Lab 11/01/16 0530  NA 139  K 3.7  CL 108  CO2 22  GLUCOSE 123*  BUN 20  CREATININE 0.99  CALCIUM  8.5*  GFRNONAA 51*  GFRAA 59*  ANIONGAP 9    No results for input(s): PROT, ALBUMIN, AST, ALT, ALKPHOS, BILITOT in the last 168 hours. Hematology  Recent Labs Lab 11/01/16 0530  WBC 8.7  RBC 4.82  HGB 14.5  HCT 44.4  MCV 92.1  MCH 30.1  MCHC 32.7  RDW 15.0  PLT 247   Cardiac EnzymesNo results for input(s): TROPONINI in the last 168 hours. No results for input(s): TROPIPOC in the last 168 hours.  BNPNo results for input(s): BNP, PROBNP in the last 168 hours.  DDimer No results for input(s): DDIMER in the last 168 hours.  Radiology/Studies:  Dg Chest Port 1 View  Result Date: 11/01/2016 CLINICAL DATA:  Atrial fibrillation and shortness of breath tonight. EXAM: PORTABLE CHEST 1 VIEW COMPARISON:  01/02/2016 FINDINGS: Cardiac enlargement without pulmonary vascular congestion. No airspace disease or consolidation in the lungs. No blunting of costophrenic angles. No pneumothorax.  Calcification of the aorta. Moderate esophageal hiatal hernia behind the heart. Degenerative changes in the shoulders. IMPRESSION: Cardiac enlargement.  No evidence of active pulmonary disease. Electronically Signed   By: Burman Nieves M.D.   On: 11/01/2016 06:17    Assessment and Plan:   1. Paroxysmal atrial fibrillation with rapid ventricular response, currently on IV diltiazem with slowing of rate. Usual home medications include diltiazem 30 mg 3 times a day and atenolol 100 mg daily. She is also on chronic Coumadin anticoagulation. Will continue atenolol, IV diltiazem, and give IV amiodarone to help facilitate conversion. She has had multiple prior admissions for atrial fibrillation but has never required electrical cardioversion. CHADS VASC 5 2. Chronic anticoagulation with Coumadin. Current INR is 3.01. We'll have pharmacy help manage anticoagulation while hospitalized. CHADS VASC 5 3. Hypertension with controlled blood pressure currently. 4. Rheumatoid arthritis with corticosteroid dependence. Will  continue maintenance dose of prednisone. 5. History of asthma. 6. Obstructive sleep apnea requiring C Pap 7. Type 2 diabetes mellitus, with unknown glycemic control.  Severity of Illness: The appropriate patient status for this patient is INPATIENT. Inpatient status is judged to be reasonable and necessary in order to provide the required intensity of service to ensure the patient's safety. The patient's presenting symptoms, physical exam findings, and initial radiographic and laboratory data in the context of their chronic comorbidities is felt to place them at high risk for further clinical deterioration. Furthermore, it is not anticipated that the patient will be medically stable for discharge from the hospital within 2 midnights of admission. The following factors support the patient status of inpatient.   " The patient's presenting symptoms include dyspnea and weakness. " The worrisome physical exam findings include severe tachycardia. " The initial radiographic and laboratory data are worrisome because of cardiac enlargement. " The chronic co-morbidities include diabetes mellitus, obstructive sleep apnea, and asthma..   * I certify that at the point of admission it is my clinical judgment that the patient will require inpatient hospital care spanning beyond 2 midnights from the point of admission due to high intensity of service, high risk for further deterioration and high frequency of surveillance required.*    Signed, Lesleigh Noe, MD  11/01/2016 8:30 AM

## 2016-11-01 NOTE — ED Notes (Signed)
Assisted patient with use of bedpan.

## 2016-11-01 NOTE — ED Provider Notes (Signed)
[facility]EMER[facility]ENCY DEPT Provider Note   CSN: [id number] Arrival date & time: 11/01/16  4656     History   Chief Complaint Chief Complaint  Patient presents with  . Atrial Fibrillation  . Chest Pain    HPI Michelle Douglas is a 83 y.o. female.  HPI  Patient presents with palpitations, mild chest discomfort. Symptoms awoke her from sleep approximately 2 hours prior to ED arrival. Patient notes a history of atrial fibrillation, takes all medication as directed, has had no recent medication changes. Aside from 2 episodes of diaphoresis of the past 2 days, which were nonsustained, with no palpitations, she has been doing generally well. She does note prior episodes of atrial fibrillation with tachycardia in spite of taking all medication. Typically these improved after ospital treatment with medication.   I discussed the patient's presentation with EMS.    Past Medical History:  Diagnosis Date  . Arthritis   . Atrial fibrillation (HCC)   . Depression   . Hypertension   . RLS (restless legs syndrome)     There are no active problems to display for this patient.   Past Surgical History:  Procedure Laterality Date  . BACK SURGERY    . KNEE ARTHROSCOPY    . SHOULDER SURGERY      OB History    No data available       Home Medications    Prior to Admission medications   Medication Sig Start Date End Date Taking? Authorizing Provider  albuterol (PROVENTIL HFA;VENTOLIN HFA) 108 (90 Base) MCG/ACT inhaler Inhale 1-2 puffs into the lungs every 6 (six) hours as needed for wheezing or shortness of breath.   Yes [provider]  atorvastatin (LIPITOR) 20 MG tablet Take 20 mg by mouth every evening.   Yes [provider]  Budesonide-Formoterol Fumarate (SYMBICORT IN) Inhale 1-2 puffs into the lungs daily as needed (shortness of breath).   Yes [provider]  diltiazem (CARDIZEM) 30 MG tablet Take 30 mg by mouth 2 (two) times daily.   Yes [provider]  folic acid (FOLVITE) 1 MG tablet Take 1 mg by mouth daily.   Yes [provider]  methotrexate (50 MG/ML) 1 g injection Inject 2 mg into the vein once a week. saturday   Yes [provider]  omeprazole (PRILOSEC) 20 MG capsule Take 20 mg by mouth daily.   Yes [provider]  rOPINIRole (REQUIP) 0.25 MG tablet Take 0.5 mg by mouth at bedtime.   Yes [provider]  warfarin (COUMADIN) 2.5 MG tablet Take 2.5 mg by mouth daily.   Yes [provider]    Family History No family history on file.  Social History Social History  Substance Use Topics  . Smoking status: Never Smoker  . Smokeless tobacco: Not on file  . Alcohol use No     Allergies   Other   Review of Systems Review of Systems  Constitutional:       Per HPI, otherwise negative  HENT:       Per HPI, otherwise negative  Respiratory:       Per HPI, otherwise negative  Cardiovascular:       Per HPI, otherwise negative  Gastrointestinal: Negative for vomiting.  Endocrine:       Negative aside from HPI  Genitourinary:       Neg aside from HPI   Musculoskeletal:       Per HPI, otherwise negative  Skin: Negative.   Neurological:  Negative for syncope.     Physical Exam Updated Vital Signs BP 121/84   Pulse 90   Temp 98.7 F (37.1 C) (Oral)   Resp (!) 24   SpO2 95%   Physical Exam  Constitutional: She is oriented to person, place, and time. She appears well-developed and well-nourished. No distress.  HENT:  Head: Normocephalic and atraumatic.  Eyes: Conjunctivae and EOM are normal.  Cardiovascular: An irregularly irregular rhythm present. Tachycardia present.   Pulmonary/Chest: Effort normal and breath sounds normal. No stridor. No respiratory distress.  Abdominal: She exhibits no distension.  Musculoskeletal: She exhibits no edema.  Neurological: She is alert and oriented to person, place, and time. No cranial nerve deficit.  Skin: Skin is  warm and dry.  Psychiatric: She has a normal mood and affect.  Nursing note and vitals reviewed.    ED Treatments / Results  Labs (all labs ordered are listed, but only abnormal results are displayed) Labs Reviewed  BASIC METABOLIC PANEL - Abnormal; Notable for the following:       Result Value   Glucose, Bld 123 (*)    Calcium 8.5 (*)    GFR calc non Af Amer 51 (*)    GFR calc Af Amer 59 (*)    All other components within normal limits  MAGNESIUM  CBC    EKG  EKG Interpretation  Date/Time:  Wednesday November 01 2016 05:21:41 EDT Ventricular Rate:  126 PR Interval:    QRS Duration: 86 QT Interval:  322 QTC Calculation: 438 R Axis:   -2 Text Interpretation:  Atrial fibrillation Abnormal ekg Confirmed by Gerhard Munch [phone]) on 11/01/2016 6:13:37 AM       Radiology Dg Chest Port 1 View  Result Date: 11/01/2016 CLINICAL DATA:  Atrial fibrillation and shortness of breath tonight. EXAM: PORTABLE CHEST 1 VIEW COMPARISON:  01/02/2016 FINDINGS: Cardiac enlargement without pulmonary vascular congestion. No airspace disease or consolidation in the lungs. No blunting of costophrenic angles. No pneumothorax. Calcification of the aorta. Moderate esophageal hiatal hernia behind the heart. Degenerative changes in the shoulders. IMPRESSION: Cardiac enlargement.  No evidence of active pulmonary disease. Electronically Signed   By: Burman Nieves M.D.   On: 11/01/2016 06:17    Procedures Procedures (including critical care time)  Medications Ordered in ED Medications  diltiazem (CARDIZEM) 1 mg/mL load via infusion 20 mg (20 mg Intravenous Bolus from Bag 11/01/16 0602)    And  diltiazem (CARDIZEM) 100 mg in dextrose 5 % 100 mL (1 mg/mL) infusion (5 mg/hr Intravenous New Bag/Given 11/01/16 0602)     Initial Impression / Assessment and Plan / ED Course  I have reviewed the triage vital signs and the nursing notes.  Pertinent labs & imaging results that were available during my care  of the patient were reviewed by me and considered in my medical decision making (see chart for details).   After the initial evaluation, given the patient's history of atrial fibrillation, she started on a Cardizem drip after receiving a bolus dose of 20 mg per   6:26 AM Patient has diminished tachycardia, but remains in atrial fibrillation, she is receiving Cardizem, without new complications. Initial labs were reassuring, x-ray are reassuring.  Given the patient's return to atrial fibrillation, in spite of taking all medication as directed, I will discuss her case with our cardiology colleagues for further evaluation and management.  Final Clinical Impressions(s) / ED Diagnoses  AFIB with rapid ventricular response.  CRITICAL CARE Performed by: Gerhard Munch Total  critical care time: 35 minutes Critical care time was exclusive of separately billable procedures and treating other patients. Critical care was necessary to treat or prevent imminent or life-threatening deterioration. Critical care was time spent personally by me on the following activities: development of treatment plan with patient and/or surrogate as well as nursing, discussions with consultants, evaluation of patient's response to treatment, examination of patient, obtaining history from patient or surrogate, ordering and performing treatments and interventions, ordering and review of laboratory studies, ordering and review of radiographic studies, pulse oximetry and re-evaluation of patient's condition.    Gerhard Munch, MD 11/01/16 [phone]

## 2016-11-01 NOTE — ED Notes (Signed)
Cardiology updated about pt being in NSR at present; no amiodarone to be given at present

## 2016-11-01 NOTE — ED Triage Notes (Signed)
Pt reports Monday while volunteering she became diaphoretic and dizziness. Continual off and on diaphoresis with dizziness since Monday. Last night was unable to sleep, walking around and began experiencing chest pressure 4/10. Given 324 Asprin and 2 nitro without relief to pain. Afib upon arrival ranging from 115-140. 20G PIV placed to LAC.

## 2016-11-01 NOTE — ED Notes (Signed)
Checked patient cbg it was 35 notified RN of blood sugar patient is resting with call bell in reach

## 2016-11-01 NOTE — ED Notes (Signed)
X-ray at bedside

## 2016-11-01 NOTE — ED Notes (Signed)
Attempted report 

## 2016-11-02 ENCOUNTER — Inpatient Hospital Stay (HOSPITAL_COMMUNITY): Payer: Medicare Other

## 2016-11-02 DIAGNOSIS — I10 Essential (primary) hypertension: Secondary | ICD-10-CM | POA: Diagnosis not present

## 2016-11-02 DIAGNOSIS — Z6841 Body Mass Index (BMI) 40.0 and over, adult: Secondary | ICD-10-CM

## 2016-11-02 DIAGNOSIS — Z9989 Dependence on other enabling machines and devices: Secondary | ICD-10-CM

## 2016-11-02 DIAGNOSIS — Z7901 Long term (current) use of anticoagulants: Secondary | ICD-10-CM | POA: Diagnosis not present

## 2016-11-02 DIAGNOSIS — I4891 Unspecified atrial fibrillation: Secondary | ICD-10-CM | POA: Diagnosis not present

## 2016-11-02 DIAGNOSIS — I340 Nonrheumatic mitral (valve) insufficiency: Secondary | ICD-10-CM

## 2016-11-02 DIAGNOSIS — Z79899 Other long term (current) drug therapy: Secondary | ICD-10-CM | POA: Diagnosis not present

## 2016-11-02 DIAGNOSIS — G4733 Obstructive sleep apnea (adult) (pediatric): Secondary | ICD-10-CM

## 2016-11-02 DIAGNOSIS — I480 Paroxysmal atrial fibrillation: Secondary | ICD-10-CM | POA: Diagnosis not present

## 2016-11-02 LAB — ECHOCARDIOGRAM COMPLETE
Height: 62 in
WEIGHTICAEL: 3764.8 [oz_av]

## 2016-11-02 LAB — LIPID PANEL
CHOLESTEROL: 152 mg/dL (ref 0–200)
HDL: 50 mg/dL (ref >40)
LDL Cholesterol: 80 mg/dL (ref 0–99)
Total CHOL/HDL Ratio: 3 RATIO
Triglycerides: 110 mg/dL (ref <150)
VLDL: 22 mg/dL (ref 0–40)

## 2016-11-02 LAB — PROTIME-INR
INR: 1.7
Prothrombin Time: 19.9 seconds — ABNORMAL HIGH (ref 11.4–15.2)

## 2016-11-02 LAB — BASIC METABOLIC PANEL
Anion gap: 7 (ref 5–15)
BUN: 17 mg/dL (ref 6–20)
CHLORIDE: 106 mmol/L (ref 101–111)
CO2: 25 mmol/L (ref 22–32)
CREATININE: 0.99 mg/dL (ref 0.44–1.00)
Calcium: 8.3 mg/dL — ABNORMAL LOW (ref 8.9–10.3)
GFR, EST AFRICAN AMERICAN: 59 mL/min — AB (ref >60)
GFR, EST NON AFRICAN AMERICAN: 51 mL/min — AB (ref >60)
Glucose, Bld: 103 mg/dL — ABNORMAL HIGH (ref 65–99)
Potassium: 3.8 mmol/L (ref 3.5–5.1)
SODIUM: 138 mmol/L (ref 135–145)

## 2016-11-02 LAB — GLUCOSE, CAPILLARY
GLUCOSE-CAPILLARY: 107 mg/dL — AB (ref 65–99)
Glucose-Capillary: 135 mg/dL — ABNORMAL HIGH (ref 65–99)

## 2016-11-02 LAB — TROPONIN I

## 2016-11-02 MED ORDER — WARFARIN SODIUM 4 MG PO TABS
4.0000 mg | ORAL_TABLET | Freq: Once | ORAL | Status: AC
  Administered 2016-11-02: 4 mg via ORAL
  Filled 2016-11-02: qty 1

## 2016-11-02 MED ORDER — DILTIAZEM HCL ER COATED BEADS 120 MG PO CP24
120.0000 mg | ORAL_CAPSULE | Freq: Every day | ORAL | Status: DC
  Administered 2016-11-02: 120 mg via ORAL
  Filled 2016-11-02: qty 1

## 2016-11-02 MED ORDER — ATENOLOL 100 MG PO TABS: 100.0000 mg | ORAL_TABLET | Freq: Every day | ORAL | 2 refills | Status: AC

## 2016-11-02 MED ORDER — WARFARIN SODIUM 4 MG PO TABS: 4.0000 mg | ORAL_TABLET | Freq: Once | ORAL | Status: DC

## 2016-11-02 MED ORDER — DILTIAZEM HCL ER COATED BEADS 120 MG PO CP24
120.0000 mg | ORAL_CAPSULE | Freq: Every day | ORAL | 2 refills | Status: DC
Start: 2016-11-02 — End: 2017-01-30

## 2016-11-02 NOTE — Progress Notes (Signed)
  Echocardiogram 2D Echocardiogram has been performed.  Michelle Douglas T Michelle Douglas 11/02/2016, 9:40 AM

## 2016-11-02 NOTE — Care Management CC44 (Signed)
Condition Code 44 Documentation Completed  Patient Details  Name: Derinda Bartus MRN: [id number] Date of Birth: [birth year]   Condition Code 44 given:  Yes Patient signature on Condition Code 44 notice:  Yes Documentation of 2 MD's agreement:  Yes Code 44 added to claim:  Yes    Gala Lewandowsky, RN 11/02/2016, 2:26 PM

## 2016-11-02 NOTE — Progress Notes (Signed)
Pt w/ HR 55-60 per Tele, NSR. Cardizem gtt stopped per Order. Notified MD on call. Will continue to monitor.

## 2016-11-02 NOTE — Discharge Summary (Addendum)
Discharge Summary    Patient ID: Michelle Douglas,  MRN: [id number], DOB/AGE: 1929-12-27 83 y.o.  Admit date: 11/01/2016 Discharge date: 11/02/2016  Primary Care Provider: Ardyth Gal Primary Cardiologist: Rudolpho Sevin  Discharge Diagnoses    Active Problems:   PAF (paroxysmal atrial fibrillation) (HCC)   Diabetes mellitus, type 2 (HCC)   Long term current use of anticoagulant therapy   Morbid obesity with BMI of 40.0-44.9, adult (HCC)   OSA on CPAP   Allergies Allergies  Allergen Reactions  . Other Itching and Rash    Antibiotic but she is not sure which one.  Maybe starts with a D for UTI     Diagnostic Studies/Procedures    N/a _____________   History of Present Illness     Michelle Douglas is a 83 y.o. female with a history of Paroxysmal atrial fibrillation with rapid ventricular response and other medical problems including obstructive sleep apnea, diabetes mellitus type 2, hyperlipidemia, hypertension, chronic Coumadin anticoagulation therapy, degenerative arthritis, rheumatoid arthritis, chronic steroid use, hiatal hernia, restless leg syndrome, and depression. The current episode of atrial fibrillation started at around 3 AM. Ms. Eilert was unable to sleep the night prior to admission and while up at around 3 AM developed palpitations, weakness, and dyspnea. She has a history of paroxysmal atrial fibrillation and the symptoms were compatible with her prior presentations. She did not have syncope. She was asymptomatic when sitting or lying. She was brought to the [facility] emergency room because they were full at [facility]. She is managed by Dr. Sandy Salaam in [facility]. It seems that cardiac medications included atenolol, diltiazem, and Coumadin. There is no history of stroke. History of atrial fibrillation and chronic Coumadin therapy date back approximately 3 years. She was started on IV Cardizem and admitted for further management.   Hospital Course       INR was 3.01 on admission. Continued on atenolol, and IV diltiazem with conversion back to SR several hours after admission. Maintained SR throughout admission. No complaints noted. Did report that she was taking Diltiazem 30mg  BID at home, and reports being instructed to take TID, but often missing the 3rd dose. Was converted to Diltiazem 120mg  daily this admission and continued on atenolol 100mg  daily. Felt well the following morning. She was given a 4mg  dose of coumadin prior to discharge, with plans to resume home dose.   General: Well developed, well nourished, female appearing in no acute distress. Head: Normocephalic, atraumatic.  Neck: Supple without bruits, JVD. Lungs:  Resp regular and unlabored, CTA. Heart: RRR, S1, S2, no S3, S4, or murmur; no rub. Abdomen: Soft, non-tender, non-distended with normoactive bowel sounds. No hepatomegaly. No rebound/guarding. No obvious abdominal masses. Extremities: No clubbing, cyanosis, edema. Distal pedal pulses are 2+ bilaterally. Neuro: Alert and oriented X 3. Moves all extremities spontaneously. Psych: Normal affect.  [name] was seen by Dr. and determined stable for discharge home. Follow up in the office has been arranged. Medications are listed below.   _____________  Discharge Vitals Blood pressure (!) 142/65, pulse 62, temperature (!) 97.5 F (36.4 C), resp. rate 18, height 5\' 2"  (1.575 m), weight 235 lb 4.8 oz (106.7 kg), SpO2 96 %.  Filed Weights   11/01/16 1700 11/02/16 0417  Weight: 243 lb (110.2 kg) 235 lb 4.8 oz (106.7 kg)    Labs & Radiologic Studies    CBC  Recent Labs  11/01/16 0530 11/01/16 1408  WBC 8.7 8.7  NEUTROABS  --  5.9  HGB 14.5 15.2*  HCT 44.4 47.4*  MCV 92.1 91.9  PLT 247 235   Basic Metabolic Panel  Recent Labs  11/01/16 0530 11/01/16 1408 11/02/16 0253  NA 139 140 138  K 3.7 4.0 3.8  CL 108 107 106  CO2 22 20* 25  GLUCOSE 123* 123* 103*  BUN 20 15 17   CREATININE 0.99 0.88  0.99  CALCIUM 8.5* 8.8* 8.3*  MG 2.3 2.1  --    Liver Function Tests  Recent Labs  11/01/16 1408  AST 29  ALT 22  ALKPHOS 80  BILITOT 1.2  PROT 7.3  ALBUMIN 3.6   No results for input(s): LIPASE, AMYLASE in the last 72 hours. Cardiac Enzymes  Recent Labs  11/01/16 1408 11/01/16 1909 11/02/16 0253  TROPONINI 0.03* 0.03* <0.03   BNP Invalid input(s): POCBNP D-Dimer No results for input(s): DDIMER in the last 72 hours. Hemoglobin A1C  Recent Labs  11/01/16 1408  HGBA1C 6.1*   Fasting Lipid Panel  Recent Labs  11/02/16 0253  CHOL 152  HDL 50  LDLCALC 80  TRIG 110  CHOLHDL 3.0   Thyroid Function Tests  Recent Labs  11/01/16 1408  TSH 1.066   _____________  Dg Chest Port 1 View  Result Date: 11/01/2016 CLINICAL DATA:  Atrial fibrillation and shortness of breath tonight. EXAM: PORTABLE CHEST 1 VIEW COMPARISON:  01/02/2016 FINDINGS: Cardiac enlargement without pulmonary vascular congestion. No airspace disease or consolidation in the lungs. No blunting of costophrenic angles. No pneumothorax. Calcification of the aorta. Moderate esophageal hiatal hernia behind the heart. Degenerative changes in the shoulders. IMPRESSION: Cardiac enlargement.  No evidence of active pulmonary disease. Electronically Signed   By: 13/06/2015 M.D.   On: 11/01/2016 06:17   Disposition   Pt is being discharged home today in good condition.  Follow-up Plans & Appointments    Follow-up Information    01/01/2017, MD Follow up.   Specialty:  Cardiology Why:  Please call and arrange follow up within the next 2 weeks.  Contact information: [address] [place] [phone]        [phone], MD Follow up.   Specialty:  Internal Medicine Why:  please have your INR levels rechecked within the next week.  Contact information: [address] [place] [phone]          Discharge Instructions    Amb  referral to AFIB Clinic    Complete by:  As directed    Diet - low sodium heart healthy    Complete by:  As directed    Increase activity slowly    Complete by:  As directed       Discharge Medications     Medication List    STOP taking these medications   diltiazem 30 MG tablet Commonly known as:  CARDIZEM     TAKE these medications   albuterol 108 (90 Base) MCG/ACT inhaler Commonly known as:  PROVENTIL HFA;VENTOLIN HFA Inhale 1-2 puffs into the lungs every 6 (six) hours as needed for wheezing or shortness of breath.   atenolol 100 MG tablet Commonly known as:  TENORMIN Take 1 tablet (100 mg total) by mouth daily.   atorvastatin 20 MG tablet Commonly known as:  LIPITOR Take 20 mg by mouth every evening.   diltiazem 120 MG 24 hr capsule Commonly known as:  CARDIZEM CD Take 1 capsule (120 mg total) by mouth daily.  folic acid 1 MG tablet Commonly known as:  FOLVITE Take 1 mg by mouth daily.   methotrexate 50 MG/2ML injection Inject 10 mg into the skin once a week.   omeprazole 20 MG capsule Commonly known as:  PRILOSEC Take 20 mg by mouth daily.   rOPINIRole 0.25 MG tablet Commonly known as:  REQUIP Take 0.5 mg by mouth at bedtime.   SYMBICORT IN Inhale 1-2 puffs into the lungs daily as needed (shortness of breath).   warfarin 2.5 MG tablet Commonly known as:  COUMADIN Take 2.5 mg by mouth daily.         Outstanding Labs/Studies   Need to have INR checked within the next week.   Duration of Discharge Encounter   Greater than 30 minutes including physician time.  Signed, Laverda Page NP-C 11/02/2016, 1:39 PM   The patient has been seen in conjunction with Laverda Page, NP- C. All aspects of care have been considered and discussed. The patient has been personally interviewed, examined, and all clinical data has been reviewed.   Reverted to normal sinus rhythm on IV diltiazem. IV amiodarone order had been written but not given prior to  conversion.  Converted over to slow-release diltiazem 120 mg per day. We'll need to monitor for development of excessive bradycardia.  We will send information to Dr. Sandy Salaam  The patient requires chronic anticoagulation because of Chads Vasc score of 5  I recommended to the patient that she follow-up with him within the next 7-10 days to make sure he agrees with the medication adjustments.

## 2016-11-02 NOTE — Care Management Obs Status (Signed)
MEDICARE OBSERVATION STATUS NOTIFICATION   Patient Details  Name: Michelle Douglas MRN: [id number] Date of Birth: 1929/03/24   Medicare Observation Status Notification Given:  Yes    Gala Lewandowsky, RN 11/02/2016, 2:25 PM

## 2016-11-02 NOTE — Progress Notes (Signed)
ANTICOAGULATION CONSULT NOTE   Pharmacy Consult for Coumadin  Indication: atrial fibrillation  Allergies  Allergen Reactions  . Other Itching and Rash    Antibiotic but she is not sure which one.  Maybe starts with a D for UTI     Patient Measurements: Height: 5\' 2"  (157.5 cm) Weight: 235 lb 4.8 oz (106.7 kg) IBW/kg (Calculated) : 50.1 Heparin Dosing Weight: n/a   Vital Signs: Temp: 97.5 F (36.4 C) (09/06 0417) BP: 157/55 (09/06 0417) Pulse Rate: 73 (09/06 0417)  Labs:  Recent Labs  11/01/16 0530 11/01/16 1408 11/01/16 1909 11/02/16 0253  HGB 14.5 15.2*  --   --   HCT 44.4 47.4*  --   --   PLT 247 235  --   --   LABPROT  --  19.6*  --  19.9*  INR  --  1.68  --  1.70  CREATININE 0.99 0.88  --  0.99  TROPONINI  --  0.03* 0.03* <0.03    Estimated Creatinine Clearance: 48.5 mL/min (by C-G formula based on SCr of 0.99 mg/dL).   Medical History: Past Medical History:  Diagnosis Date  . Arrhythmia   . Arthritis   . Asthma   . Atrial fibrillation (HCC)   . Depression   . Diabetes mellitus without complication (HCC)   . Hyperlipidemia   . Hypertension   . RLS (restless legs syndrome)     Medications:  Prescriptions Prior to Admission  Medication Sig Dispense Refill Last Dose  . albuterol (PROVENTIL HFA;VENTOLIN HFA) 108 (90 Base) MCG/ACT inhaler Inhale 1-2 puffs into the lungs every 6 (six) hours as needed for wheezing or shortness of breath.   unk  . atorvastatin (LIPITOR) 20 MG tablet Take 20 mg by mouth every evening.   10/31/2016 at Unknown time  . Budesonide-Formoterol Fumarate (SYMBICORT IN) Inhale 1-2 puffs into the lungs daily as needed (shortness of breath).   unk  . diltiazem (CARDIZEM) 30 MG tablet Take 30 mg by mouth 2 (two) times daily.   10/31/2016 at Unknown time  . folic acid (FOLVITE) 1 MG tablet Take 1 mg by mouth daily.   10/31/2016 at Unknown time  . [START ON 11/04/2016] methotrexate 50 MG/2ML injection Inject 10 mg into the skin once a week.    10/28/2016  . omeprazole (PRILOSEC) 20 MG capsule Take 20 mg by mouth daily.   10/31/2016 at Unknown time  . rOPINIRole (REQUIP) 0.25 MG tablet Take 0.5 mg by mouth at bedtime.   10/31/2016 at Unknown time  . warfarin (COUMADIN) 2.5 MG tablet Take 2.5 mg by mouth daily.   10/31/2016 at 2400    Assessment: 63 YOF with h/o pAFib with RVR here with palpitations, weakness and dyspnea. Pharmacy consulted to resume home Coumadin for AFib. INR on admission is sub-therapeutic at 1.68.  -INR= 1.7  Home Coumadin dose: 2.5 mg daily.    Goal of Therapy:  INR 2-3 Monitor platelets by anticoagulation protocol: Yes   Plan:  -Coumadin 4mg  today -Monitor daily PT/INR   97, Pharm D 11/02/2016 8:59 AM

## 2016-11-02 NOTE — Plan of Care (Signed)
Problem: Pain Managment: Goal: General experience of comfort will improve Outcome: Progressing Pt denied pain throughout shift.  Problem: Physical Regulation: Goal: Ability to maintain clinical measurements within normal limits will improve Outcome: Progressing VSS throughout shift. NSR/SB on monitor. Cardizem gtt stopped r/t rate <60. MD made aware. Pt asymptomatic. Will continue to monitor.

## 2016-11-02 NOTE — Progress Notes (Signed)
Order clarification w/ MD regarding Cardizem. Pt currently NSR. Continue Cardizem. Stop drip 2 hours after pt's first dose of PO Cardizem. Will continue to monitor.

## 2016-11-02 NOTE — Progress Notes (Signed)
Pt d/c home in stable condition, in no acute distress.  Daughter at bedside who will take pt home.  PIV d/c and catheter intact.  All d/c paperwork given to pt and reviewed in detail.  Rx sent to her pharmacy and pt aware and states she will pick them up tonight.

## 2016-11-06 ENCOUNTER — Telehealth (HOSPITAL_COMMUNITY): Payer: Self-pay | Admitting: *Deleted

## 2016-11-06 NOTE — Telephone Encounter (Signed)
LMOM for pt to return call re: referral for afib clinic from ED

## 2017-01-30 ENCOUNTER — Other Ambulatory Visit: Payer: Self-pay | Admitting: Cardiology

## 2017-07-05 HISTORY — PX: HERNIA REPAIR: SHX51

## 2017-07-21 ENCOUNTER — Inpatient Hospital Stay (HOSPITAL_COMMUNITY)
Admission: EM | Admit: 2017-07-21 | Discharge: 2017-07-25 | DRG: 393 | Disposition: A | Discharge status: Other Acute Inpatient Hospital | Payer: Medicare Other | Attending: Internal Medicine | Admitting: Internal Medicine

## 2017-07-21 DIAGNOSIS — Z66 Do not resuscitate: Secondary | ICD-10-CM | POA: Diagnosis present

## 2017-07-21 DIAGNOSIS — Z885 Allergy status to narcotic agent status: Secondary | ICD-10-CM

## 2017-07-21 DIAGNOSIS — Z79891 Long term (current) use of opiate analgesic: Secondary | ICD-10-CM

## 2017-07-21 DIAGNOSIS — M069 Rheumatoid arthritis, unspecified: Secondary | ICD-10-CM | POA: Diagnosis present

## 2017-07-21 DIAGNOSIS — K219 Gastro-esophageal reflux disease without esophagitis: Secondary | ICD-10-CM | POA: Diagnosis present

## 2017-07-21 DIAGNOSIS — R1110 Vomiting, unspecified: Secondary | ICD-10-CM

## 2017-07-21 DIAGNOSIS — K223 Perforation of esophagus: Secondary | ICD-10-CM | POA: Diagnosis present

## 2017-07-21 DIAGNOSIS — R112 Nausea with vomiting, unspecified: Secondary | ICD-10-CM

## 2017-07-21 DIAGNOSIS — E119 Type 2 diabetes mellitus without complications: Secondary | ICD-10-CM

## 2017-07-21 DIAGNOSIS — K9189 Other postprocedural complications and disorders of digestive system: Secondary | ICD-10-CM | POA: Diagnosis not present

## 2017-07-21 DIAGNOSIS — D72829 Elevated white blood cell count, unspecified: Secondary | ICD-10-CM | POA: Diagnosis present

## 2017-07-21 DIAGNOSIS — G4733 Obstructive sleep apnea (adult) (pediatric): Secondary | ICD-10-CM | POA: Diagnosis present

## 2017-07-21 DIAGNOSIS — I4891 Unspecified atrial fibrillation: Secondary | ICD-10-CM | POA: Diagnosis present

## 2017-07-21 DIAGNOSIS — Z6835 Body mass index (BMI) 35.0-35.9, adult: Secondary | ICD-10-CM

## 2017-07-21 DIAGNOSIS — K3189 Other diseases of stomach and duodenum: Secondary | ICD-10-CM | POA: Diagnosis present

## 2017-07-21 DIAGNOSIS — Z79899 Other long term (current) drug therapy: Secondary | ICD-10-CM

## 2017-07-21 DIAGNOSIS — R402412 Glasgow coma scale score 13-15, at arrival to emergency department: Secondary | ICD-10-CM | POA: Diagnosis present

## 2017-07-21 DIAGNOSIS — F329 Major depressive disorder, single episode, unspecified: Secondary | ICD-10-CM | POA: Diagnosis present

## 2017-07-21 DIAGNOSIS — I110 Hypertensive heart disease with heart failure: Secondary | ICD-10-CM | POA: Diagnosis present

## 2017-07-21 DIAGNOSIS — K449 Diaphragmatic hernia without obstruction or gangrene: Secondary | ICD-10-CM

## 2017-07-21 DIAGNOSIS — I10 Essential (primary) hypertension: Secondary | ICD-10-CM | POA: Diagnosis present

## 2017-07-21 DIAGNOSIS — E785 Hyperlipidemia, unspecified: Secondary | ICD-10-CM | POA: Diagnosis present

## 2017-07-21 DIAGNOSIS — Z7984 Long term (current) use of oral hypoglycemic drugs: Secondary | ICD-10-CM

## 2017-07-21 DIAGNOSIS — Z90710 Acquired absence of both cervix and uterus: Secondary | ICD-10-CM

## 2017-07-21 DIAGNOSIS — E669 Obesity, unspecified: Secondary | ICD-10-CM | POA: Diagnosis present

## 2017-07-21 DIAGNOSIS — I5032 Chronic diastolic (congestive) heart failure: Secondary | ICD-10-CM | POA: Diagnosis present

## 2017-07-21 DIAGNOSIS — G2581 Restless legs syndrome: Secondary | ICD-10-CM | POA: Diagnosis present

## 2017-07-21 DIAGNOSIS — J90 Pleural effusion, not elsewhere classified: Secondary | ICD-10-CM

## 2017-07-21 DIAGNOSIS — Y832 Surgical operation with anastomosis, bypass or graft as the cause of abnormal reaction of the patient, or of later complication, without mention of misadventure at the time of the procedure: Secondary | ICD-10-CM | POA: Diagnosis present

## 2017-07-21 DIAGNOSIS — Z9889 Other specified postprocedural states: Secondary | ICD-10-CM

## 2017-07-21 DIAGNOSIS — Z7901 Long term (current) use of anticoagulants: Secondary | ICD-10-CM

## 2017-07-21 DIAGNOSIS — I480 Paroxysmal atrial fibrillation: Secondary | ICD-10-CM | POA: Diagnosis present

## 2017-07-21 DIAGNOSIS — E876 Hypokalemia: Secondary | ICD-10-CM | POA: Diagnosis not present

## 2017-07-21 DIAGNOSIS — Z888 Allergy status to other drugs, medicaments and biological substances status: Secondary | ICD-10-CM

## 2017-07-21 HISTORY — DX: Personal history of other diseases of the digestive system: Z87.19

## 2017-07-21 NOTE — ED Notes (Signed)
Patient transported to X-ray 

## 2017-07-21 NOTE — ED Triage Notes (Signed)
Pt BIB [facility] for chest pain over the past 4 days. She had a hernia repair revision in may. She has had decreased appetite. The CP is constant but increase with eating and coughing. Pt had nausea tonight and family administer an ODT zofran that helped. Pt was 90% on RA

## 2017-07-22 ENCOUNTER — Emergency Department (HOSPITAL_COMMUNITY): Payer: Medicare Other

## 2017-07-22 ENCOUNTER — Encounter (HOSPITAL_COMMUNITY): Payer: Self-pay

## 2017-07-22 DIAGNOSIS — I130 Hypertensive heart and chronic kidney disease with heart failure and stage 1 through stage 4 chronic kidney disease, or unspecified chronic kidney disease: Secondary | ICD-10-CM | POA: Diagnosis not present

## 2017-07-22 DIAGNOSIS — Z7984 Long term (current) use of oral hypoglycemic drugs: Secondary | ICD-10-CM | POA: Diagnosis not present

## 2017-07-22 DIAGNOSIS — K9189 Other postprocedural complications and disorders of digestive system: Secondary | ICD-10-CM | POA: Diagnosis present

## 2017-07-22 DIAGNOSIS — Z79891 Long term (current) use of opiate analgesic: Secondary | ICD-10-CM | POA: Diagnosis not present

## 2017-07-22 DIAGNOSIS — E876 Hypokalemia: Secondary | ICD-10-CM | POA: Diagnosis not present

## 2017-07-22 DIAGNOSIS — G2581 Restless legs syndrome: Secondary | ICD-10-CM | POA: Diagnosis present

## 2017-07-22 DIAGNOSIS — I5032 Chronic diastolic (congestive) heart failure: Secondary | ICD-10-CM | POA: Diagnosis present

## 2017-07-22 DIAGNOSIS — K3189 Other diseases of stomach and duodenum: Secondary | ICD-10-CM | POA: Diagnosis present

## 2017-07-22 DIAGNOSIS — Z66 Do not resuscitate: Secondary | ICD-10-CM | POA: Diagnosis present

## 2017-07-22 DIAGNOSIS — Z7901 Long term (current) use of anticoagulants: Secondary | ICD-10-CM | POA: Diagnosis not present

## 2017-07-22 DIAGNOSIS — Z9889 Other specified postprocedural states: Secondary | ICD-10-CM | POA: Diagnosis not present

## 2017-07-22 DIAGNOSIS — R112 Nausea with vomiting, unspecified: Secondary | ICD-10-CM | POA: Diagnosis not present

## 2017-07-22 DIAGNOSIS — Y832 Surgical operation with anastomosis, bypass or graft as the cause of abnormal reaction of the patient, or of later complication, without mention of misadventure at the time of the procedure: Secondary | ICD-10-CM | POA: Diagnosis present

## 2017-07-22 DIAGNOSIS — I480 Paroxysmal atrial fibrillation: Secondary | ICD-10-CM | POA: Diagnosis present

## 2017-07-22 DIAGNOSIS — G4733 Obstructive sleep apnea (adult) (pediatric): Secondary | ICD-10-CM | POA: Diagnosis present

## 2017-07-22 DIAGNOSIS — Z6835 Body mass index (BMI) 35.0-35.9, adult: Secondary | ICD-10-CM | POA: Diagnosis not present

## 2017-07-22 DIAGNOSIS — Z79899 Other long term (current) drug therapy: Secondary | ICD-10-CM | POA: Diagnosis not present

## 2017-07-22 DIAGNOSIS — E785 Hyperlipidemia, unspecified: Secondary | ICD-10-CM | POA: Diagnosis present

## 2017-07-22 DIAGNOSIS — I110 Hypertensive heart disease with heart failure: Secondary | ICD-10-CM | POA: Diagnosis present

## 2017-07-22 DIAGNOSIS — K449 Diaphragmatic hernia without obstruction or gangrene: Secondary | ICD-10-CM | POA: Diagnosis not present

## 2017-07-22 DIAGNOSIS — E119 Type 2 diabetes mellitus without complications: Secondary | ICD-10-CM | POA: Diagnosis present

## 2017-07-22 DIAGNOSIS — D72829 Elevated white blood cell count, unspecified: Secondary | ICD-10-CM | POA: Diagnosis present

## 2017-07-22 DIAGNOSIS — K223 Perforation of esophagus: Secondary | ICD-10-CM | POA: Diagnosis present

## 2017-07-22 DIAGNOSIS — F329 Major depressive disorder, single episode, unspecified: Secondary | ICD-10-CM | POA: Diagnosis present

## 2017-07-22 DIAGNOSIS — M069 Rheumatoid arthritis, unspecified: Secondary | ICD-10-CM | POA: Diagnosis present

## 2017-07-22 DIAGNOSIS — R402412 Glasgow coma scale score 13-15, at arrival to emergency department: Secondary | ICD-10-CM | POA: Diagnosis present

## 2017-07-22 DIAGNOSIS — E669 Obesity, unspecified: Secondary | ICD-10-CM | POA: Diagnosis present

## 2017-07-22 DIAGNOSIS — Z888 Allergy status to other drugs, medicaments and biological substances status: Secondary | ICD-10-CM | POA: Diagnosis not present

## 2017-07-22 DIAGNOSIS — Z885 Allergy status to narcotic agent status: Secondary | ICD-10-CM | POA: Diagnosis not present

## 2017-07-22 LAB — CBC
HCT: 36.3 % (ref 36.0–46.0)
Hemoglobin: 11.5 g/dL — ABNORMAL LOW (ref 12.0–15.0)
MCH: 27.6 pg (ref 26.0–34.0)
MCHC: 31.7 g/dL (ref 30.0–36.0)
MCV: 87.1 fL (ref 78.0–100.0)
PLATELETS: 498 10*3/uL — AB (ref 150–400)
RBC: 4.17 MIL/uL (ref 3.87–5.11)
RDW: 14.2 % (ref 11.5–15.5)
WBC: 13.9 10*3/uL — AB (ref 4.0–10.5)

## 2017-07-22 LAB — BASIC METABOLIC PANEL
Anion gap: 9 (ref 5–15)
BUN: 15 mg/dL (ref 6–20)
CALCIUM: 8.5 mg/dL — AB (ref 8.9–10.3)
CO2: 22 mmol/L (ref 22–32)
CREATININE: 0.8 mg/dL (ref 0.44–1.00)
Chloride: 103 mmol/L (ref 101–111)
GFR calc Af Amer: >60 mL/min (ref >60)
GFR calc non Af Amer: >60 mL/min (ref >60)
Glucose, Bld: 151 mg/dL — ABNORMAL HIGH (ref 65–99)
Potassium: 3.9 mmol/L (ref 3.5–5.1)
SODIUM: 134 mmol/L — AB (ref 135–145)

## 2017-07-22 LAB — URINALYSIS, ROUTINE W REFLEX MICROSCOPIC
BILIRUBIN URINE: NEGATIVE
GLUCOSE, UA: NEGATIVE mg/dL
HGB URINE DIPSTICK: NEGATIVE
KETONES UR: NEGATIVE mg/dL
Leukocytes, UA: NEGATIVE
Nitrite: NEGATIVE
PROTEIN: NEGATIVE mg/dL
Specific Gravity, Urine: 1.017 (ref 1.005–1.030)
pH: 6 (ref 5.0–8.0)

## 2017-07-22 LAB — HEPATIC FUNCTION PANEL
ALK PHOS: 90 U/L (ref 38–126)
ALT: 14 U/L (ref 14–54)
AST: 21 U/L (ref 15–41)
Albumin: 2.7 g/dL — ABNORMAL LOW (ref 3.5–5.0)
BILIRUBIN DIRECT: 0.4 mg/dL (ref 0.1–0.5)
BILIRUBIN INDIRECT: 0.9 mg/dL (ref 0.3–0.9)
TOTAL PROTEIN: 6.4 g/dL — AB (ref 6.5–8.1)
Total Bilirubin: 1.3 mg/dL — ABNORMAL HIGH (ref 0.3–1.2)

## 2017-07-22 LAB — I-STAT TROPONIN, ED: TROPONIN I, POC: 0.05 ng/mL (ref 0.00–0.08)

## 2017-07-22 LAB — HEMOGLOBIN A1C
Hgb A1c MFr Bld: 5.7 % — ABNORMAL HIGH (ref 4.8–5.6)
Mean Plasma Glucose: 116.89 mg/dL

## 2017-07-22 LAB — BRAIN NATRIURETIC PEPTIDE: B NATRIURETIC PEPTIDE 5: 164.6 pg/mL — AB (ref 0.0–100.0)

## 2017-07-22 LAB — LIPASE, BLOOD: LIPASE: 21 U/L (ref 11–51)

## 2017-07-22 LAB — SURGICAL PCR SCREEN
MRSA, PCR: NEGATIVE
STAPHYLOCOCCUS AUREUS: NEGATIVE

## 2017-07-22 LAB — PROTIME-INR
INR: 2
Prothrombin Time: 22.5 seconds — ABNORMAL HIGH (ref 11.4–15.2)

## 2017-07-22 MED ORDER — ONDANSETRON HCL 4 MG/2ML IJ SOLN
4.0000 mg | Freq: Once | INTRAMUSCULAR | Status: AC
  Administered 2017-07-22: 4 mg via INTRAVENOUS
  Filled 2017-07-22: qty 2

## 2017-07-22 MED ORDER — IOPAMIDOL (ISOVUE-300) INJECTION 61%
INTRAVENOUS | Status: AC
  Filled 2017-07-22: qty 50

## 2017-07-22 MED ORDER — SENNOSIDES-DOCUSATE SODIUM 8.6-50 MG PO TABS: 1.0000 | ORAL_TABLET | Freq: Every evening | ORAL | Status: DC | PRN

## 2017-07-22 MED ORDER — VANCOMYCIN HCL IN DEXTROSE 1-5 GM/200ML-% IV SOLN
1000.0000 mg | INTRAVENOUS | Status: AC
  Administered 2017-07-22: 1000 mg via INTRAVENOUS
  Filled 2017-07-22: qty 200

## 2017-07-22 MED ORDER — VANCOMYCIN HCL IN DEXTROSE 1-5 GM/200ML-% IV SOLN
1000.0000 mg | Freq: Once | INTRAVENOUS | Status: AC
  Administered 2017-07-22: 1000 mg via INTRAVENOUS
  Filled 2017-07-22: qty 200

## 2017-07-22 MED ORDER — ONDANSETRON HCL 4 MG PO TABS: 4.0000 mg | ORAL_TABLET | Freq: Four times a day (QID) | ORAL | Status: DC | PRN

## 2017-07-22 MED ORDER — LORAZEPAM 2 MG/ML IJ SOLN
0.5000 mg | Freq: Every evening | INTRAMUSCULAR | Status: DC | PRN
  Administered 2017-07-22: 0.5 mg via INTRAVENOUS
  Filled 2017-07-22: qty 1

## 2017-07-22 MED ORDER — MUPIROCIN 2 % EX OINT
1.0000 "application " | TOPICAL_OINTMENT | Freq: Two times a day (BID) | CUTANEOUS | Status: AC
  Administered 2017-07-23 – 2017-07-24 (×3): 1 via NASAL
  Filled 2017-07-22 (×3): qty 22

## 2017-07-22 MED ORDER — LACTATED RINGERS IV SOLN
INTRAVENOUS | Status: DC
  Administered 2017-07-22 – 2017-07-25 (×4): via INTRAVENOUS

## 2017-07-22 MED ORDER — PIPERACILLIN-TAZOBACTAM 3.375 G IVPB
3.3750 g | Freq: Three times a day (TID) | INTRAVENOUS | Status: DC
  Administered 2017-07-22 – 2017-07-24 (×5): 3.375 g via INTRAVENOUS
  Filled 2017-07-22 (×5): qty 50

## 2017-07-22 MED ORDER — VANCOMYCIN HCL IN DEXTROSE 1-5 GM/200ML-% IV SOLN
1000.0000 mg | Freq: Two times a day (BID) | INTRAVENOUS | Status: DC
  Administered 2017-07-23 – 2017-07-24 (×3): 1000 mg via INTRAVENOUS
  Filled 2017-07-22 (×4): qty 200

## 2017-07-22 MED ORDER — ACETAMINOPHEN 325 MG PO TABS: 650.0000 mg | ORAL_TABLET | Freq: Four times a day (QID) | ORAL | Status: DC | PRN

## 2017-07-22 MED ORDER — VANCOMYCIN HCL IN DEXTROSE 1-5 GM/200ML-% IV SOLN: 1000.0000 mg | Freq: Two times a day (BID) | INTRAVENOUS | Status: DC

## 2017-07-22 MED ORDER — PIPERACILLIN-TAZOBACTAM 3.375 G IVPB 30 MIN
3.3750 g | Freq: Once | INTRAVENOUS | Status: AC
  Administered 2017-07-22: 3.375 g via INTRAVENOUS
  Filled 2017-07-22: qty 50

## 2017-07-22 MED ORDER — FENTANYL CITRATE (PF) 100 MCG/2ML IJ SOLN
50.0000 ug | Freq: Once | INTRAMUSCULAR | Status: AC
  Administered 2017-07-22: 50 ug via INTRAVENOUS
  Filled 2017-07-22: qty 2

## 2017-07-22 MED ORDER — FUROSEMIDE 10 MG/ML IJ SOLN
40.0000 mg | Freq: Once | INTRAMUSCULAR | Status: AC
  Administered 2017-07-22: 40 mg via INTRAVENOUS
  Filled 2017-07-22: qty 4

## 2017-07-22 MED ORDER — VANCOMYCIN HCL IN DEXTROSE 1-5 GM/200ML-% IV SOLN: 1000.0000 mg | Freq: Once | INTRAVENOUS | Status: DC

## 2017-07-22 MED ORDER — MORPHINE SULFATE (PF) 4 MG/ML IV SOLN
2.0000 mg | INTRAVENOUS | Status: DC | PRN
  Administered 2017-07-22 – 2017-07-25 (×7): 2 mg via INTRAVENOUS
  Filled 2017-07-22 (×7): qty 1

## 2017-07-22 MED ORDER — ACETAMINOPHEN 650 MG RE SUPP: 650.0000 mg | Freq: Four times a day (QID) | RECTAL | Status: DC | PRN

## 2017-07-22 MED ORDER — ONDANSETRON HCL 4 MG/2ML IJ SOLN
4.0000 mg | Freq: Four times a day (QID) | INTRAMUSCULAR | Status: DC | PRN
  Administered 2017-07-22 – 2017-07-25 (×5): 4 mg via INTRAVENOUS
  Filled 2017-07-22 (×5): qty 2

## 2017-07-22 MED ORDER — FENTANYL CITRATE (PF) 100 MCG/2ML IJ SOLN
100.0000 ug | Freq: Once | INTRAMUSCULAR | Status: AC
  Administered 2017-07-22: 100 ug via INTRAVENOUS
  Filled 2017-07-22: qty 2

## 2017-07-22 MED ORDER — IOHEXOL 300 MG/ML  SOLN
100.0000 mL | Freq: Once | INTRAMUSCULAR | Status: AC | PRN
  Administered 2017-07-22: 100 mL via INTRAVENOUS

## 2017-07-22 MED ORDER — BUDESONIDE-FORMOTEROL FUMARATE 80-4.5 MCG/ACT IN AERO: 2.0000 | INHALATION_SPRAY | Freq: Every day | RESPIRATORY_TRACT | Status: DC | PRN

## 2017-07-22 NOTE — ED Notes (Signed)
Dr. Oswaldo Done returned page. States to hold vanc until med clarified. Held at this time.

## 2017-07-22 NOTE — ED Notes (Signed)
Pt ambulated to the bathroom with slight shortness of breath upon returning to bed.

## 2017-07-22 NOTE — ED Notes (Signed)
Lab contacted to addon hepatic function, BNP, and lipase

## 2017-07-22 NOTE — Consult Note (Signed)
Michelle Douglas       ,Michelle Douglas             [phone]      Cardiothoracic Surgery Consultation  Reason for Consult: Possible esophageal perforation s/p recent redo repair of paraesophageal hernia Referring Physician: Dr. Cephas Darby Michelle Douglas is an 84 y.o. female.  HPI:   The patient is an 84 year old woman with a history of diabetes, hyperlipidemia, hypertension, atrial fibrillation on Coumadin, rheumatoid arthritis on prednisone who underwent a robotic assisted repair of a paraesophageal hernia with gastric volvulus with a Toupet fundoplication at [facility] in September 2018.  The patient apparently did well initially but then developed recurrent dysphagia and reflux and was significantly symptomatic.  Her family took her to [facility] clinic where she was evaluated and subsequently underwent surgery.  She was found to have a recurrent paraesophageal hernia with twisting of the previous fundoplication and herniation in the chest.  She underwent laparoscopic takedown of the previous fundoplication with repair of the paraesophageal hernia as well as wedge fundectomy and modified Collis gastroplasty by Dr. Judeth Porch.  This was performed on Jul 06, 2017 and the patient was discharged on 07/09/2017.  According to her daughter who is with her today she was doing fairly well at home and eating a soft diet without difficulty.  She said that she woke up this past Wednesday with pain in her chest which moved into her upper abdomen and back.  This was associated with nausea and decreased oral intake as well as a low-grade fever around 99.  She said that yesterday she was able to take some liquids but then had more pain last night and came to the emergency room.  She has not had a bowel movement since last Monday.  She denies any shortness of breath.  She had a CT scan of the abdomen with contrast on overnight which showed a recurrent large hiatal hernia.  There  is abnormal fluid and edema within the hernia sac with suspected gastric wall thickening of the herniated stomach.  There is no mediastinal air.  There is small pleural effusions.  She underwent an esophagram with water-soluble contrast which was read as showing extravasation of the contrast material from the distal esophagus which filled a large cavity surrounding the distal esophagus.  Past Medical History:  Diagnosis Date  . Arrhythmia   . Arthritis   . Asthma   . Atrial fibrillation ([place])   . Depression   . Diabetes mellitus without complication ([place])   . Hyperlipidemia   . Hypertension   . RLS (restless legs syndrome)     Past Surgical History:  Procedure Laterality Date  . BACK SURGERY    . KNEE ARTHROSCOPY    . SHOULDER SURGERY      No family history on file.  Social History:  reports that she has never smoked. She has never used smokeless tobacco. She reports that she does not drink alcohol or use drugs.  Allergies:  Allergies  Allergen Reactions  . Codeine Nausea And Vomiting  . Dilaudid [Hydromorphone Hcl] Other (See Comments)    Pain, twitching  . Nitrofurantoin Hives, Itching and Dermatitis       . Other Itching and Rash    Antibiotic but she is not sure which one.  Maybe starts with a D for UTI  Other reaction(s): Rash Antibiotic but she is not sure which one.Maybe starts with a D for UTI  Medications:  I have reviewed the patient's current medications. Prior to Admission:  (Not in a hospital admission) Scheduled: . iopamidol      . iopamidol       Continuous: . lactated ringers 75 mL/hr at 07/22/17 1410  . piperacillin-tazobactam (ZOSYN)  IV    . [START ON 07/23/2017] vancomycin     GPQ:DIYMEBRAXENMM **OR** acetaminophen, LORazepam, morphine injection, ondansetron **OR** ondansetron (ZOFRAN) IV, senna-docusate Anti-infectives (From admission, onward)   Start     Dose/Rate Route Frequency Ordered Stop   07/23/17 0300  vancomycin (VANCOCIN)  IVPB 1000 mg/200 mL premix     1,000 mg 200 mL/hr over 60 Minutes Intravenous Every 12 hours 07/22/17 1347     07/23/17 0000  vancomycin (VANCOCIN) IVPB 1000 mg/200 mL premix  Status:  Discontinued     1,000 mg 200 mL/hr over 60 Minutes Intravenous Every 12 hours 07/22/17 1109 07/22/17 1311   07/22/17 2000  piperacillin-tazobactam (ZOSYN) IVPB 3.375 g     3.375 g 12.5 mL/hr over 240 Minutes Intravenous Every 8 hours 07/22/17 1111     07/22/17 1400  vancomycin (VANCOCIN) IVPB 1000 mg/200 mL premix     1,000 mg 200 mL/hr over 60 Minutes Intravenous STAT 07/22/17 1347 07/22/17 1554   07/22/17 1115  vancomycin (VANCOCIN) IVPB 1000 mg/200 mL premix  Status:  Discontinued     1,000 mg 200 mL/hr over 60 Minutes Intravenous  Once 07/22/17 1109 07/22/17 1347   07/22/17 1015  vancomycin (VANCOCIN) IVPB 1000 mg/200 mL premix     1,000 mg 200 mL/hr over 60 Minutes Intravenous  Once 07/22/17 1012 07/22/17 1127   07/22/17 1015  piperacillin-tazobactam (ZOSYN) IVPB 3.375 g     3.375 g 100 mL/hr over 30 Minutes Intravenous  Once 07/22/17 1012 07/22/17 1123      Results for orders placed or performed during the hospital encounter of 07/21/17 (from the past 48 hour(s))  Basic metabolic panel     Status: Abnormal   Collection Time: 07/22/17  1:13 AM  Result Value Ref Range   Sodium 134 (L) 135 - 145 mmol/L   Potassium 3.9 3.5 - 5.1 mmol/L   Chloride 103 101 - 111 mmol/L   CO2 22 22 - 32 mmol/L   Glucose, Bld 151 (H) 65 - 99 mg/dL   BUN 15 6 - 20 mg/dL   Creatinine, Ser 0.80 0.44 - 1.00 mg/dL   Calcium 8.5 (L) 8.9 - 10.3 mg/dL   GFR calc non Af Amer >60 >60 mL/min   GFR calc Af Amer >60 >60 mL/min    Comment: (NOTE) The eGFR has been calculated using the CKD EPI equation. This calculation has not been validated in all clinical situations. eGFR's persistently <60 mL/min signify possible Chronic Kidney Disease.    Anion gap 9 5 - 15    Comment: Performed at [facility] [address], [place], [place] [address]  CBC     Status: Abnormal   Collection Time: 07/22/17  1:13 AM  Result Value Ref Range   WBC 13.9 (H) 4.0 - 10.5 K/uL   RBC 4.17 3.87 - 5.11 MIL/uL   Hemoglobin 11.5 (L) 12.0 - 15.0 g/dL   HCT 36.3 36.0 - 46.0 %   MCV 87.1 78.0 - 100.0 fL   MCH 27.6 26.0 - 34.0 pg   MCHC 31.7 30.0 - 36.0 g/dL   RDW 14.2 11.5 - 15.5 %   Platelets 498 (H) 150 - 400 K/uL    Comment:  Performed at [facility], [place] [address], [place], [place] [address]  Lipase, blood     Status: None   Collection Time: 07/22/17  1:13 AM  Result Value Ref Range   Lipase 21 11 - 51 U/L    Comment: Performed at [facility] [address] [place], [place] [address]  Hepatic function panel     Status: Abnormal   Collection Time: 07/22/17  1:13 AM  Result Value Ref Range   Total Protein 6.4 (L) 6.5 - 8.1 g/dL   Albumin 2.7 (L) 3.5 - 5.0 g/dL   AST 21 15 - 41 U/L   ALT 14 14 - 54 U/L   Alkaline Phosphatase 90 38 - 126 U/L   Total Bilirubin 1.3 (H) 0.3 - 1.2 mg/dL   Bilirubin, Direct 0.4 0.1 - 0.5 mg/dL   Indirect Bilirubin 0.9 0.3 - 0.9 mg/dL    Comment: Performed at [facility] [address], [place], [place] [address]  Brain natriuretic peptide     Status: Abnormal   Collection Time: 07/22/17  1:13 AM  Result Value Ref Range   B Natriuretic Peptide 164.6 (H) 0.0 - 100.0 pg/mL    Comment: Performed at [facility] [address] [place], [place] [address]  Protime-INR     Status: Abnormal   Collection Time: 07/22/17  1:13 AM  Result Value Ref Range   Prothrombin Time 22.5 (H) 11.4 - 15.2 seconds   INR 2.00     Comment: Performed at [facility] [address] [place], [place] [address]  I-stat troponin, ED     Status: None   Collection Time: 07/22/17  1:18 AM  Result Value Ref Range   Troponin i, poc 0.05 0.00 - 0.08 ng/mL   Comment 3            Comment: Due to the release kinetics of cTnI, a negative result within the first hours of the onset of  symptoms does not rule out myocardial infarction with certainty. If myocardial infarction is still suspected, repeat the test at appropriate intervals.   Urinalysis, Routine w reflex microscopic     Status: Abnormal   Collection Time: 07/22/17  3:52 AM  Result Value Ref Range   Color, Urine STRAW (A) YELLOW   APPearance CLEAR CLEAR   Specific Gravity, Urine 1.017 1.005 - 1.030   pH 6.0 5.0 - 8.0   Glucose, UA NEGATIVE NEGATIVE mg/dL   Hgb urine dipstick NEGATIVE NEGATIVE   Bilirubin Urine NEGATIVE NEGATIVE   Ketones, ur NEGATIVE NEGATIVE mg/dL   Protein, ur NEGATIVE NEGATIVE mg/dL   Nitrite NEGATIVE NEGATIVE   Leukocytes, UA NEGATIVE NEGATIVE    Comment: Performed at [facility] [address] [place], [place] [address]  Hemoglobin A1c     Status: Abnormal   Collection Time: 07/22/17 12:40 PM  Result Value Ref Range   Hgb A1c MFr Bld 5.7 (H) 4.8 - 5.6 %    Comment: (NOTE) Pre diabetes:          5.7%-6.4% Diabetes:              >6.4% Glycemic control for   <7.0% adults with diabetes    Mean Plasma Glucose 116.89 mg/dL    Comment: Performed at [facility] [address] [place],  [address]    Dg Chest 1 View  Addendum Date: 07/22/2017   ADDENDUM REPORT: 07/22/2017 10:38 ADDENDUM: Upon review  of esophagram performed today as well, there does not appear to be hiatal hernia present currently. The soft tissue density seen behind the heart may be related to large perforation noted on the esophagram. Electronically Signed   By: Marijo Conception, M.D.   On: 07/22/2017 10:38   Result Date: 07/22/2017 CLINICAL DATA:  Vomiting. EXAM: CHEST  1 VIEW COMPARISON:  Radiographs of Jul 21, 2017. FINDINGS: Stable cardiomegaly. No pneumothorax or pleural effusion is noted. Stable large hiatal hernia is noted. No acute pulmonary disease is noted. Bony thorax is unremarkable. IMPRESSION: Stable large hiatal hernia. No acute cardiopulmonary abnormality seen. Electronically  Signed: By: Marijo Conception, M.D. On: 07/22/2017 09:53   Dg Chest 2 View  Result Date: 07/22/2017 CLINICAL DATA:  Chest pain with deep breathing. Shortness of breath tonight. History of hypertension, atrial fibrillation, asthma EXAM: CHEST - 2 VIEW COMPARISON:  06/06/2017 FINDINGS: Shallow inspiration. Cardiac enlargement with mild pulmonary vascular congestion. Hazy perihilar infiltrates likely due to mild edema. Small bilateral pleural effusions with basilar atelectasis. Findings are developing since previous study. Esophageal hiatal hernia behind the heart. No pneumothorax. Calcification of the aorta. Degenerative changes in the spine and shoulders. IMPRESSION: Developing congestive changes with cardiac enlargement, pulmonary vascular congestion, perihilar edema, and bilateral pleural effusions. Bilateral basilar atelectasis. Esophageal hiatal hernia behind the heart. Aortic atherosclerosis. Electronically Signed   By: Lucienne Capers M.D.   On: 07/22/2017 00:21   Ct Abdomen Pelvis W Contrast  Result Date: 07/22/2017 CLINICAL DATA:  Recent hiatal hernia revision, nausea and vomiting EXAM: CT ABDOMEN AND PELVIS WITH CONTRAST TECHNIQUE: Multidetector CT imaging of the abdomen and pelvis was performed using the standard protocol following bolus administration of intravenous contrast. CONTRAST:  159m OMNIPAQUE IOHEXOL 300 MG/ML  SOLN COMPARISON:  CT 12/27/2016, 11/28/2016, 09/29/2016 FINDINGS: Lower chest: Small pleural effusions and passive atelectasis at the bases. Cardiomegaly. Moderate large hiatal hernia, with new surgical sutures within the herniated portion of the stomach as well as along the portion of stomach within the abdominal cavity. Abnormal fluid and edema within the hernia sac. Herniated stomach appears thickened. No extraluminal gas. Hepatobiliary: No focal liver abnormality is seen. No gallstones, gallbladder wall thickening, or biliary dilatation. Pancreas: Unremarkable. No pancreatic  ductal dilatation or surrounding inflammatory changes. Spleen: Normal in size without focal abnormality. Adrenals/Urinary Tract: Adrenal glands are unremarkable. Kidneys are normal, without renal calculi, focal lesion, or hydronephrosis. Bladder is unremarkable. Stomach/Bowel: Stomach is within normal limits. Appendix appears normal. No evidence of bowel wall thickening, distention, or inflammatory changes. Vascular/Lymphatic: Nonaneurysmal aorta. Mild to moderate aortic atherosclerosis. No significantly enlarged lymph nodes Reproductive: Status post hysterectomy. No adnexal masses. Other: Negative for free air or free fluid. Small fat in the umbilical region. Tiny subcutaneous nodules, some of which are decrease. Musculoskeletal: Trace retrolisthesis of L5 on S1 with trace anterolisthesis of L4 on L5. Trace retrolisthesis L2 on L3. Degenerative changes. No acute or suspicious abnormality. IMPRESSION: 1. Residual moderate to large hiatal hernia, now with new surgical sutures at the herniated stomach and intra-abdominal portion of the stomach. Abnormal fluid and edema within the hernia sac with suspected gastric wall thickening of the herniated stomach. No lower mediastinal air 2. Small pleural effusions and passive atelectasis at the bases 3. There are otherwise no additional acute intra-abdominal or pelvic abnormality seen. Electronically Signed   By: KDonavan FoilM.D.   On: 07/22/2017 02:50   Dg Esophagus W/water Sol Cm  Result Date: 07/22/2017 CLINICAL DATA:  History of small  hiatal hernia repair. Concern for esophageal perforation. EXAM: ESOPHOGRAM/BARIUM SWALLOW TECHNIQUE: Single contrast examination was performed using  water-soluble. FLUOROSCOPY TIME:  Fluoroscopy Time:  1 minutes 20 seconds Radiation Exposure Index (if provided by the fluoroscopic device): Number of Acquired Spot Images: 7 COMPARISON:  CT AP 07/22/2017. FINDINGS: The patient ingested water-soluble contrast material. The proximal  esophagus appears increased in caliber and patulous. There is beak like narrowing of the distal esophagus to the level of the diaphragmatic hiatus. Contrast can be seen passing through the distal esophagus and into the stomach. Additionally, active extravasation from the right side of the distal esophagus can be seen. The extravasated contrast material appears to fill a large fluid collection which in retrospect was present on CT from earlier today. IMPRESSION: 1. Examination is positive for extravasation of water-soluble contrast material from the distal esophagus compatible with dehiscence of suture line within the distal esophagus. The contrast fills a large cavity surrounding the distal esophagus which is present on CT from earlier today. Electronically Signed   By: Kerby Moors M.D.   On: 07/22/2017 10:22    Review of Systems  Constitutional: Positive for malaise/fatigue and weight loss.       Low grade fever 99  HENT: Negative.   Eyes: Negative.   Respiratory: Negative for cough and shortness of breath.   Cardiovascular: Positive for chest pain. Negative for palpitations, orthopnea and PND.  Gastrointestinal: Positive for abdominal pain, heartburn, nausea and vomiting. Negative for blood in stool, constipation, diarrhea and melena.  Genitourinary: Negative.   Musculoskeletal: Positive for back pain.  Skin: Negative.   Neurological: Negative.   Endo/Heme/Allergies: Negative.   Psychiatric/Behavioral: Negative.    Blood pressure (!) 133/58, pulse 73, temperature 98.5 F (36.9 C), temperature source Oral, resp. rate 20, SpO2 97 %. Physical Exam  Constitutional: She is oriented to person, place, and time.  Elderly, moderately obese woman in no distress.  HENT:  Head: Normocephalic and atraumatic.  Eyes: Pupils are equal, round, and reactive to light. EOM are normal. No scleral icterus.  Cardiovascular: Normal rate, regular rhythm and normal heart sounds.  No murmur heard. Respiratory:  Effort normal and breath sounds normal. No respiratory distress. She has no wheezes. She has no rales.  GI: Soft. Bowel sounds are normal. She exhibits no distension. There is no tenderness.  Abdominal incisions healing well  Musculoskeletal: She exhibits no edema.  Neurological: She is alert and oriented to person, place, and time.  Skin: Skin is warm and dry.  Psychiatric: She has a normal mood and affect.   CLINICAL DATA:  Recent hiatal hernia revision, nausea and vomiting  EXAM: CT ABDOMEN AND PELVIS WITH CONTRAST  TECHNIQUE: Multidetector CT imaging of the abdomen and pelvis was performed using the standard protocol following bolus administration of intravenous contrast.  CONTRAST:  124m OMNIPAQUE IOHEXOL 300 MG/ML  SOLN  COMPARISON:  CT 12/27/2016, 11/28/2016, 09/29/2016  FINDINGS: Lower chest: Small pleural effusions and passive atelectasis at the bases. Cardiomegaly. Moderate large hiatal hernia, with new surgical sutures within the herniated portion of the stomach as well as along the portion of stomach within the abdominal cavity. Abnormal fluid and edema within the hernia sac. Herniated stomach appears thickened. No extraluminal gas.  Hepatobiliary: No focal liver abnormality is seen. No gallstones, gallbladder wall thickening, or biliary dilatation.  Pancreas: Unremarkable. No pancreatic ductal dilatation or surrounding inflammatory changes.  Spleen: Normal in size without focal abnormality.  Adrenals/Urinary Tract: Adrenal glands are unremarkable. Kidneys are normal, without renal  calculi, focal lesion, or hydronephrosis. Bladder is unremarkable.  Stomach/Bowel: Stomach is within normal limits. Appendix appears normal. No evidence of bowel wall thickening, distention, or inflammatory changes.  Vascular/Lymphatic: Nonaneurysmal aorta. Mild to moderate aortic atherosclerosis. No significantly enlarged lymph nodes  Reproductive: Status post  hysterectomy. No adnexal masses.  Other: Negative for free air or free fluid. Small fat in the umbilical region. Tiny subcutaneous nodules, some of which are decrease.  Musculoskeletal: Trace retrolisthesis of L5 on S1 with trace anterolisthesis of L4 on L5. Trace retrolisthesis L2 on L3. Degenerative changes. No acute or suspicious abnormality.  IMPRESSION: 1. Residual moderate to large hiatal hernia, now with new surgical sutures at the herniated stomach and intra-abdominal portion of the stomach. Abnormal fluid and edema within the hernia sac with suspected gastric wall thickening of the herniated stomach. No lower mediastinal air 2. Small pleural effusions and passive atelectasis at the bases 3. There are otherwise no additional acute intra-abdominal or pelvic abnormality seen.   Electronically Signed   By: Donavan Foil M.D.   On: 07/22/2017 02:50   CLINICAL DATA:  History of small hiatal hernia repair. Concern for esophageal perforation.  EXAM: ESOPHOGRAM/BARIUM SWALLOW  TECHNIQUE: Single contrast examination was performed using  water-soluble.  FLUOROSCOPY TIME:  Fluoroscopy Time:  1 minutes 20 seconds  Radiation Exposure Index (if provided by the fluoroscopic device):  Number of Acquired Spot Images: 7  COMPARISON:  CT AP 07/22/2017.  FINDINGS: The patient ingested water-soluble contrast material. The proximal esophagus appears increased in caliber and patulous. There is beak like narrowing of the distal esophagus to the level of the diaphragmatic hiatus. Contrast can be seen passing through the distal esophagus and into the stomach. Additionally, active extravasation from the right side of the distal esophagus can be seen. The extravasated contrast material appears to fill a large fluid collection which in retrospect was present on CT from earlier today.  IMPRESSION: 1. Examination is positive for extravasation of water-soluble contrast  material from the distal esophagus compatible with dehiscence of suture line within the distal esophagus. The contrast fills a large cavity surrounding the distal esophagus which is present on CT from earlier today.   Electronically Signed   By: Kerby Moors M.D.   On: 07/22/2017 10:22   Assessment/Plan:  This 84 year old woman has recently had a redo repair of a recurrent type IV paraesophageal hernia by laparoscopic takedown of her previous repair with a wedge fundectomy and modified Collis gastroplasty.  I have reviewed all of her records from the [facility] clinic through [facility] in [place]. She now presents with recent epigastric and lower chest pain associated with dysphagia, nausea, and some vomiting.  CT of the abdomen shows recurrence of the hiatal hernia with the repair up in the chest.  Esophagram shows contrast getting through the distal esophagus down into the abdominal remnant of the stomach.  There was felt to be some extravasation of contrast from the distal esophagus into the hiatal hernia sac.  It is not clear to me if this is really extravasation or contrast within the Collis wrap.  I do not see any air within the mediastinum.  The only area that I see is within the hernia sac on both sides of the esophagus which looks like it could be within the gastric wrap itself.  In addition she does not look like the typical patient who has an esophageal perforation.  She has no fever, minimal elevation of her white blood cell count of 13,000,  no tachycardia or hypotension, and overall is doing well clinically at this time.  Considering that her symptoms started on Wednesday of this week and it is now Saturday I would expect her to be quite ill if this was indeed a esophageal perforation or leak.  I would recommend keeping her n.p.o. on antibiotics at this time and consulting general surgery to see if they have any other suggestions for further work-up.  I do not think there is any  indication for thoracic surgery at this time.  I discussed all this with the patient and her daughter at the bedside as well as another daughter who was on the telephone listening in.  All their questions have been answered.  I spent 60 minutes performing this consultation and > 50% of this time was spent face to face counseling and coordinating the care of this patient's possible distal esophageal leak.  Michelle Douglas 07/22/2017, 4:19 PM

## 2017-07-22 NOTE — ED Provider Notes (Signed)
[facility] EMERGENCY DEPARTMENT Provider Note   CSN: [id number] Arrival date & time: 07/21/17  2339     History   Chief Complaint Chief Complaint  Patient presents with  . Chest Pain    HPI Michelle Douglas is a 84 y.o. female.  The history is provided by the patient and a relative.  Abdominal Pain   This is a new problem. The current episode started more than 2 days ago. The problem occurs constantly. The problem has been gradually worsening. The pain is located in the epigastric region. The pain is moderate. Associated symptoms include fever, nausea and vomiting. Pertinent negatives include diarrhea. The symptoms are aggravated by palpation. Nothing relieves the symptoms.   Patient with history of diabetes and hypertension presents with abdominal pain.  She reports for at least 4 days she has been having epigastric abdominal pain and nausea/vomiting.  She also reports fevers.  She also reports the pain extends from her epigastric region into her chest.  She underwent a laparoscopic hiatal hernia repair on May 9 at the [facility].  Since returning home last weekend, she is gradually been worsening.  She reports since the surgery she has been able to eat and keep food and fluids down, but reports food tastes terrible and she has no appetite Past Medical History:  Diagnosis Date  . Arrhythmia   . Arthritis   . Asthma   . Atrial fibrillation (HCC)   . Depression   . Diabetes mellitus without complication (HCC)   . Hyperlipidemia   . Hypertension   . RLS (restless legs syndrome)     Patient Active Problem List   Diagnosis Date Noted  . PAF (paroxysmal atrial fibrillation) (HCC) 05/22/2016  . Morbid obesity with BMI of 40.0-44.9, adult (HCC) 01/13/2016  . Primary osteoarthritis of right knee 12/08/2015  . Primary osteoarthritis of left knee 09/22/2015  . Long term current use of anticoagulant therapy 07/30/2015  . Mild intermittent asthma  without complication 06/08/2015  . OSA on CPAP 06/08/2015  . Pulmonary hypertension due to sleep-disordered breathing (HCC) 06/08/2015  . Exudative age-related macular degeneration of right eye with inactive choroidal neovascularization (HCC) 04/21/2015  . Atrial fibrillation (HCC) 03/26/2015  . Rheumatoid arthritis involving left foot (HCC) 09/28/2014  . Essential hypertension 02/07/2014  . Gastroesophageal reflux disease without esophagitis 02/07/2014  . Hyperlipidemia 02/07/2014  . Lumbar degenerative disc disease 02/07/2014  . Lumbar stenosis with neurogenic claudication 02/07/2014  . Diabetes mellitus, type 2 (HCC) 05/10/2011    Past Surgical History:  Procedure Laterality Date  . BACK SURGERY    . KNEE ARTHROSCOPY    . SHOULDER SURGERY       OB History   None      Home Medications    Prior to Admission medications   Medication Sig Start Date End Date Taking? Authorizing Provider  albuterol (PROVENTIL HFA;VENTOLIN HFA) 108 (90 Base) MCG/ACT inhaler Inhale 1-2 puffs into the lungs every 6 (six) hours as needed for wheezing or shortness of breath.    [provider]  atenolol (TENORMIN) 100 MG tablet Take 1 tablet (100 mg total) by mouth daily. 11/03/16   Arty Baumgartner, NP  atorvastatin (LIPITOR) 20 MG tablet Take 20 mg by mouth every evening.    [provider]  Budesonide-Formoterol Fumarate (SYMBICORT IN) Inhale 1-2 puffs into the lungs daily as needed (shortness of breath).    [provider]  diltiazem (CARDIZEM CD) 120 MG 24 hr capsule TAKE 1  CAPSULE BY MOUTH DAILY 01/31/17   Lyn Records, MD  folic acid (FOLVITE) 1 MG tablet Take 1 mg by mouth daily.    [provider]  methotrexate 50 MG/2ML injection Inject 10 mg into the skin once a week. 11/04/16   [provider]  omeprazole (PRILOSEC) 20 MG capsule Take 20 mg by mouth daily.    [provider]  rOPINIRole (REQUIP) 0.25 MG tablet Take 0.5 mg by mouth at  bedtime.    [provider]  warfarin (COUMADIN) 2.5 MG tablet Take 2.5 mg by mouth daily.    [provider]    Family History No family history on file.  Social History Social History   Tobacco Use  . Smoking status: Never Smoker  . Smokeless tobacco: Never Used  Substance Use Topics  . Alcohol use: No  . Drug use: No     Allergies   Other   Review of Systems Review of Systems  Constitutional: Positive for fever.  Cardiovascular: Positive for chest pain.  Gastrointestinal: Positive for abdominal pain, nausea and vomiting. Negative for diarrhea.  All other systems reviewed and are negative.    Physical Exam Updated Vital Signs BP (!) 120/57   Pulse 63   Temp 98 F (36.7 C) (Oral)   Resp 15   SpO2 94%   Physical Exam CONSTITUTIONAL: Elderly, no acute distress HEAD: Normocephalic/atraumatic EYES: EOMI/PERRL, no icterus ENMT: Mucous membranes dry NECK: supple no meningeal signs SPINE/BACK:entire spine nontender CV: S1/S2 noted, no murmurs/rubs/gallops noted LUNGS: Lungs are clear to auscultation bilaterally, no apparent distress ABDOMEN: soft, moderate epigastric tenderness, no rebound or guarding, bowel sounds noted throughout abdomen Well-healing incisions noted GU:no cva tenderness NEURO: Pt is awake/alert/appropriate, moves all extremitiesx4.  No facial droop.   EXTREMITIES: pulses normal/equal, full ROM SKIN: warm, color normal PSYCH: no abnormalities of mood noted, alert and oriented to situation   ED Treatments / Results  Labs (all labs ordered are listed, but only abnormal results are displayed) Labs Reviewed  BASIC METABOLIC PANEL - Abnormal; Notable for the following components:      Result Value   Sodium 134 (*)    Glucose, Bld 151 (*)    Calcium 8.5 (*)    All other components within normal limits  CBC - Abnormal; Notable for the following components:   WBC 13.9 (*)    Hemoglobin 11.5 (*)    Platelets 498 (*)    All  other components within normal limits  HEPATIC FUNCTION PANEL - Abnormal; Notable for the following components:   Total Protein 6.4 (*)    Albumin 2.7 (*)    Total Bilirubin 1.3 (*)    All other components within normal limits  BRAIN NATRIURETIC PEPTIDE - Abnormal; Notable for the following components:   B Natriuretic Peptide 164.6 (*)    All other components within normal limits  PROTIME-INR - Abnormal; Notable for the following components:   Prothrombin Time 22.5 (*)    All other components within normal limits  URINALYSIS, ROUTINE W REFLEX MICROSCOPIC - Abnormal; Notable for the following components:   Color, Urine STRAW (*)    All other components within normal limits  LIPASE, BLOOD  I-STAT TROPONIN, ED    EKG EKG Interpretation  Date/Time:  Sunday Jul 22 2017 00:26:03 EDT Ventricular Rate:  61 PR Interval:  184 QRS Duration: 92 QT Interval:  428 QTC Calculation: 430 R Axis:   -8 Text Interpretation:  Normal sinus rhythm Normal ECG No significant  change since last tracing Confirmed by Zadie Rhine ([id number]) on 07/22/2017 12:34:25 AM   Radiology Dg Chest 2 View  Result Date: 07/22/2017 CLINICAL DATA:  Chest pain with deep breathing. Shortness of breath tonight. History of hypertension, atrial fibrillation, asthma EXAM: CHEST - 2 VIEW COMPARISON:  06/06/2017 FINDINGS: Shallow inspiration. Cardiac enlargement with mild pulmonary vascular congestion. Hazy perihilar infiltrates likely due to mild edema. Small bilateral pleural effusions with basilar atelectasis. Findings are developing since previous study. Esophageal hiatal hernia behind the heart. No pneumothorax. Calcification of the aorta. Degenerative changes in the spine and shoulders. IMPRESSION: Developing congestive changes with cardiac enlargement, pulmonary vascular congestion, perihilar edema, and bilateral pleural effusions. Bilateral basilar atelectasis. Esophageal hiatal hernia behind the heart. Aortic  atherosclerosis. Electronically Signed   By: Burman Nieves M.D.   On: 07/22/2017 00:21   Ct Abdomen Pelvis W Contrast  Result Date: 07/22/2017 CLINICAL DATA:  Recent hiatal hernia revision, nausea and vomiting EXAM: CT ABDOMEN AND PELVIS WITH CONTRAST TECHNIQUE: Multidetector CT imaging of the abdomen and pelvis was performed using the standard protocol following bolus administration of intravenous contrast. CONTRAST:  OMNIPAQUE IOHEXOL 300 MG/ML  SOLN COMPARISON:  CT 12/27/2016, 11/28/2016, 09/29/2016 FINDINGS: Lower chest: Small pleural effusions and passive atelectasis at the bases. Cardiomegaly. Moderate large hiatal hernia, with new surgical sutures within the herniated portion of the stomach as well as along the portion of stomach within the abdominal cavity. Abnormal fluid and edema within the hernia sac. Herniated stomach appears thickened. No extraluminal gas. Hepatobiliary: No focal liver abnormality is seen. No gallstones, gallbladder wall thickening, or biliary dilatation. Pancreas: Unremarkable. No pancreatic ductal dilatation or surrounding inflammatory changes. Spleen: Normal in size without focal abnormality. Adrenals/Urinary Tract: Adrenal glands are unremarkable. Kidneys are normal, without renal calculi, focal lesion, or hydronephrosis. Bladder is unremarkable. Stomach/Bowel: Stomach is within normal limits. Appendix appears normal. No evidence of bowel wall thickening, distention, or inflammatory changes. Vascular/Lymphatic: Nonaneurysmal aorta. Mild to moderate aortic atherosclerosis. No significantly enlarged lymph nodes Reproductive: Status post hysterectomy. No adnexal masses. Other: Negative for free air or free fluid. Small fat in the umbilical region. Tiny subcutaneous nodules, some of which are decrease. Musculoskeletal: Trace retrolisthesis of L5 on S1 with trace anterolisthesis of L4 on L5. Trace retrolisthesis L2 on L3. Degenerative changes. No acute or suspicious  abnormality. IMPRESSION: 1. Residual moderate to large hiatal hernia, now with new surgical sutures at the herniated stomach and intra-abdominal portion of the stomach. Abnormal fluid and edema within the hernia sac with suspected gastric wall thickening of the herniated stomach. No lower mediastinal air 2. Small pleural effusions and passive atelectasis at the bases 3. There are otherwise no additional acute intra-abdominal or pelvic abnormality seen. Electronically Signed   By: Jasmine Pang M.D.   On: 07/22/2017 02:50    Procedures Procedures    Medications Ordered in ED Medications  furosemide (LASIX) injection 40 mg (has no administration in time range)  fentaNYL (SUBLIMAZE) injection 50 mcg (50 mcg Intravenous Given 07/22/17 0325)  ondansetron (ZOFRAN) injection 4 mg (4 mg Intravenous Given 07/22/17 0325)  iohexol (OMNIPAQUE) 300 MG/ML solution 100 mL (100 mLs Intravenous Contrast Given 07/22/17 0214)     Initial Impression / Assessment and Plan / ED Course  I have reviewed the triage vital signs and the nursing notes.  Pertinent labs & imaging results that were available during my care of the patient were reviewed by me and considered in my medical decision making (see chart for details).  1:01 AM Patient presents with abdominal pain after recent surgery.  She reports recent fever and vomiting. She declines pain medicines at this time 3:32 AM CT findings discussed with on call surgeon Dr Derrell Lolling He has reviewed CT scan She will need an esophagram to r/o eosphageal leak postop.  Will keep patient NPO Patient currently stable, afebrile, BP appropriate We will keep patient n.p.o., will admit to the hospitalist, plan for esophagram.  I discussed the case with radiology, we should be able to get the esophagram done in the next 3 to 4 hours 3:52 AM Discussed CT findings and next steps with patient and daughter. If esophagram is negative, patient would prefer to be discharged. She  has mild pulmonary vascular congestion, will give her a dose of Lasix. She reports her pain earlier was diffuse throughout her abdomen and her back, that did go into her chest at times.  With deep breathing she would have some pain in her chest, but otherwise no other chest pain. Suspicion for ACS/PE is low. Overall she appears well, and is not septic appearing Esophagram has been ordered 7:12 AM Signed out to Dr. Rennis Chris with esophagram pending.  If negative, patient tolerates p.o., and no new shortness of breath she can be discharged.  She has good family support as well as home health. If esophagram is positive general surgery would need to be consulted Final Clinical Impressions(s) / ED Diagnoses   Final diagnoses:  Vomiting  Pleural effusion  Post-operative nausea and vomiting    ED Discharge Orders    None       Zadie Rhine, MD 07/22/17 [phone]

## 2017-07-22 NOTE — Progress Notes (Addendum)
Pharmacy Antibiotic Note  Michelle Douglas is a 84 y.o. female admitted on 07/21/2017 with a perforated esophagus. Pharmacy consulted to dose vancomycin and Zosyn. WBC 13.9, currently AF. Scr 0.8, estimated CrCl ~77 mL/min.  Plan: Has already received vancomycin 1000mg  IV x1 - give additional 1000mg  IV x1 to total 2000mg  loading dose, then 1000mg  IV q12h Zosyn 3.375g IV q8h F/u C&S, clinical status, renal function, de-escalation, LOT, vancomycin levels and indicated  Temp (24hrs), Avg:98.3 F (36.8 C), Min:98 F (36.7 C), Max:98.5 F (36.9 C)  Recent Labs  Lab 07/22/17 0113  WBC 13.9*  CREATININE 0.80   Allergies  Allergen Reactions  . Other Itching and Rash    Antibiotic but she is not sure which one.  Maybe starts with a D for UTI    Antimicrobials this admission: Vanc 5/26 x1 Zosyn 5/26 x1  Microbiology results: None  Thank you for allowing pharmacy to be a part of this patient's care.  07/24/17, PharmD PGY1 Pharmacy Resident Pager: [phone] 07/22/2017 11:00 AM

## 2017-07-22 NOTE — ED Provider Notes (Signed)
Complains of epigastric pain onset last night accompanied by 3 episodes of vomiting. Patient had hiatal hernia repaired at [facility] last month which complication. She underwent further repair at [facility] clinic and was discharged.says his symptoms include fever although she could not give me an accurate temperature Pain is nonradiating. No shortness of breath. No other associated symptoms on exam alert Glasgow Coma Score 15 nontoxic lungs clear to auscultation heart regular rate and rhythm abdomen obese, nontender  I viewedthe images of the esophagram and discussed films with Dr. Bradly Chris.  I consulted thoracic surgery Dr. Lavinia Sharps who recommends IV antibiotics to include vancomycin and Zosyn. Nothing by mouth. Patient not likely perative candidate. He will consult on patient and see patient while in hospital. He requestsadmission tomedical service. On-call medicine service consulted for admission Results for orders placed or performed during the hospital encounter of 07/21/17  Basic metabolic panel  Result Value Ref Range   Sodium 134 (L) 135 - 145 mmol/L   Potassium 3.9 3.5 - 5.1 mmol/L   Chloride 103 101 - 111 mmol/L   CO2 22 22 - 32 mmol/L   Glucose, Bld 151 (H) 65 - 99 mg/dL   BUN 15 6 - 20 mg/dL   Creatinine, Ser 7.42 0.44 - 1.00 mg/dL   Calcium 8.5 (L) 8.9 - 10.3 mg/dL   GFR calc non Af Amer >60 >60 mL/min   GFR calc Af Amer >60 >60 mL/min   Anion gap 9 5 - 15  CBC  Result Value Ref Range   WBC 13.9 (H) 4.0 - 10.5 K/uL   RBC 4.17 3.87 - 5.11 MIL/uL   Hemoglobin 11.5 (L) 12.0 - 15.0 g/dL   HCT 59.5 63.8 - 75.6 %   MCV 87.1 78.0 - 100.0 fL   MCH 27.6 26.0 - 34.0 pg   MCHC 31.7 30.0 - 36.0 g/dL   RDW 43.3 29.5 - 18.8 %   Platelets 498 (H) 150 - 400 K/uL  Lipase, blood  Result Value Ref Range   Lipase 21 11 - 51 U/L  Hepatic function panel  Result Value Ref Range   Total Protein 6.4 (L) 6.5 - 8.1 g/dL   Albumin 2.7 (L) 3.5 - 5.0 g/dL   AST 21 15 - 41 U/L   ALT 14 14 -  54 U/L   Alkaline Phosphatase 90 38 - 126 U/L   Total Bilirubin 1.3 (H) 0.3 - 1.2 mg/dL   Bilirubin, Direct 0.4 0.1 - 0.5 mg/dL   Indirect Bilirubin 0.9 0.3 - 0.9 mg/dL  Brain natriuretic peptide  Result Value Ref Range   B Natriuretic Peptide 164.6 (H) 0.0 - 100.0 pg/mL  Protime-INR  Result Value Ref Range   Prothrombin Time 22.5 (H) 11.4 - 15.2 seconds   INR 2.00   Urinalysis, Routine w reflex microscopic  Result Value Ref Range   Color, Urine STRAW (A) YELLOW   APPearance CLEAR CLEAR   Specific Gravity, Urine 1.017 1.005 - 1.030   pH 6.0 5.0 - 8.0   Glucose, UA NEGATIVE NEGATIVE mg/dL   Hgb urine dipstick NEGATIVE NEGATIVE   Bilirubin Urine NEGATIVE NEGATIVE   Ketones, ur NEGATIVE NEGATIVE mg/dL   Protein, ur NEGATIVE NEGATIVE mg/dL   Nitrite NEGATIVE NEGATIVE   Leukocytes, UA NEGATIVE NEGATIVE  I-stat troponin, ED  Result Value Ref Range   Troponin i, poc 0.05 0.00 - 0.08 ng/mL   Comment 3           Dg Chest 1 View  Result  Date: 07/22/2017 CLINICAL DATA:  Vomiting. EXAM: CHEST  1 VIEW COMPARISON:  Radiographs of Jul 21, 2017. FINDINGS: Stable cardiomegaly. No pneumothorax or pleural effusion is noted. Stable large hiatal hernia is noted. No acute pulmonary disease is noted. Bony thorax is unremarkable. IMPRESSION: Stable large hiatal hernia. No acute cardiopulmonary abnormality seen. Electronically Signed   By: Lupita Raider, M.D.   On: 07/22/2017 09:53   Dg Chest 2 View  Result Date: 07/22/2017 CLINICAL DATA:  Chest pain with deep breathing. Shortness of breath tonight. History of hypertension, atrial fibrillation, asthma EXAM: CHEST - 2 VIEW COMPARISON:  06/06/2017 FINDINGS: Shallow inspiration. Cardiac enlargement with mild pulmonary vascular congestion. Hazy perihilar infiltrates likely due to mild edema. Small bilateral pleural effusions with basilar atelectasis. Findings are developing since previous study. Esophageal hiatal hernia behind the heart. No pneumothorax.  Calcification of the aorta. Degenerative changes in the spine and shoulders. IMPRESSION: Developing congestive changes with cardiac enlargement, pulmonary vascular congestion, perihilar edema, and bilateral pleural effusions. Bilateral basilar atelectasis. Esophageal hiatal hernia behind the heart. Aortic atherosclerosis. Electronically Signed   By: Burman Nieves M.D.   On: 07/22/2017 00:21   Ct Abdomen Pelvis W Contrast  Result Date: 07/22/2017 CLINICAL DATA:  Recent hiatal hernia revision, nausea and vomiting EXAM: CT ABDOMEN AND PELVIS WITH CONTRAST TECHNIQUE: Multidetector CT imaging of the abdomen and pelvis was performed using the standard protocol following bolus administration of intravenous contrast. CONTRAST:  OMNIPAQUE IOHEXOL 300 MG/ML  SOLN COMPARISON:  CT 12/27/2016, 11/28/2016, 09/29/2016 FINDINGS: Lower chest: Small pleural effusions and passive atelectasis at the bases. Cardiomegaly. Moderate large hiatal hernia, with new surgical sutures within the herniated portion of the stomach as well as along the portion of stomach within the abdominal cavity. Abnormal fluid and edema within the hernia sac. Herniated stomach appears thickened. No extraluminal gas. Hepatobiliary: No focal liver abnormality is seen. No gallstones, gallbladder wall thickening, or biliary dilatation. Pancreas: Unremarkable. No pancreatic ductal dilatation or surrounding inflammatory changes. Spleen: Normal in size without focal abnormality. Adrenals/Urinary Tract: Adrenal glands are unremarkable. Kidneys are normal, without renal calculi, focal lesion, or hydronephrosis. Bladder is unremarkable. Stomach/Bowel: Stomach is within normal limits. Appendix appears normal. No evidence of bowel wall thickening, distention, or inflammatory changes. Vascular/Lymphatic: Nonaneurysmal aorta. Mild to moderate aortic atherosclerosis. No significantly enlarged lymph nodes Reproductive: Status post hysterectomy. No adnexal masses.  Other: Negative for free air or free fluid. Small fat in the umbilical region. Tiny subcutaneous nodules, some of which are decrease. Musculoskeletal: Trace retrolisthesis of L5 on S1 with trace anterolisthesis of L4 on L5. Trace retrolisthesis L2 on L3. Degenerative changes. No acute or suspicious abnormality. IMPRESSION: 1. Residual moderate to large hiatal hernia, now with new surgical sutures at the herniated stomach and intra-abdominal portion of the stomach. Abnormal fluid and edema within the hernia sac with suspected gastric wall thickening of the herniated stomach. No lower mediastinal air 2. Small pleural effusions and passive atelectasis at the bases 3. There are otherwise no additional acute intra-abdominal or pelvic abnormality seen. Electronically Signed   By: Jasmine Pang M.D.   On: 07/22/2017 02:50  1045pain improved after treatment with intravenous fentanyl. Patient resting comfortably. Internal medicine resident physician consultedand will arrange for admission Diagnosis esophageal perforation   Doug Sou, MD 07/22/17 1103

## 2017-07-22 NOTE — ED Notes (Signed)
Attempted to call report, RN to call back.

## 2017-07-22 NOTE — H&P (Addendum)
Date: 07/22/2017               Patient Name:  Michelle Douglas MRN: [id number]  DOB: 1929-05-15 Age / Sex: 84 y.o., female   PCP: Ardyth Gal, MD         Medical Service: Internal Medicine Teaching Service         Attending Physician: Dr. Oswaldo Done, Marquita Palms, *    First Contact: Dr. Minda Meo Pager: [phone]  Second Contact: Dr. Nelson Chimes Pager: [phone]       After Hours (After 5p/  First Contact Pager: [phone]  weekends / holidays): Second Contact Pager: [phone]   Chief Complaint: nausea and abdominal pain   History of Present Illness:  84 yo female with PMHx of paroxysmal atrial fibrillation on warfarin, obstructive sleep apnea, type 2 DM, HLD, hypertension, rheumatoid arthritis, hiatal hernia, restless leg syndrome, and depression presenting with abdominal pain, nausea, and generalized weakness. On 07/05/2017 the patient underwent laparoscopic repair of recurrent paraesophageal hernia and laparoscopic takedown of previous fundoplication at [facility]. This was her second hiatal hernia repair, previous surgery took place in 10/2016. The patient tolerated the procedure well and was discharged on 07/09/2017.  Post-operatively, she had been tolerating clears and advanced to soft diet without any issues. Pain was tolerable. She states that approximately 4 days ago she began having epigastric abdominal pain/lower chest pain.  This was associated with low-grade fevers (T max 99), nausea, decreased p.o. Intake/decreased appetite/aversion to food, and one episode of vomiting.  She describes her epigastric pain as sharp in quality. The pain radiates to the lower anterior chest. The pain is worsened by deep breaths, coughing, or eating solid foods. She also describes pain with swallowing liquids. States the pain is constant, rated 3/10. When exacerbated it is 8/10 in severity. Denies recent diarrhea, but had one episode of diarrhea several days ago after taking MiraLAX for constipation.  Has not had a  bowel movement in several days. Endorses chills. Denies SOB, palpitations, dysuria, or hematuria.    ED Course: Vitals: Blood pressure 121/65, pulse 63, temperature 98 F (36.7 C), resp. rate 16, SpO2 94% on RA. Labs: BMP unremarkable, CBC with WBC of 13.9, Hgb 11.5; lipase 21; LFTs WNL, albumin 2.7; I-stat trop 0.05, UA negative Meds: fentanyl, zofran, vanc/zosyn Imaging: DG Esophagus - positive for extravasation of water-soluble contrast material from the distal esophagus compatible with dehiscence of suture line within the distal esophagus. The contrast fills a large cavity surrounding the distal esophagus which is present on CT from earlier today. CT abdo/pelvis demonstrated abnormal fluid and edema within the hernia sac. Herniated stomach appears thickened. No extraluminal gas.  Meds:  Current Meds  Medication Sig  . acetaminophen (TYLENOL) 160 MG/5ML suspension Take 160 mg by mouth every 6 (six) hours as needed for mild pain.  Marland Kitchen albuterol (PROVENTIL HFA;VENTOLIN HFA) 108 (90 Base) MCG/ACT inhaler Inhale 1-2 puffs into the lungs every 6 (six) hours as needed for wheezing or shortness of breath.  Marland Kitchen atenolol (TENORMIN) 100 MG tablet Take 1 tablet (100 mg total) by mouth daily.  Marland Kitchen atorvastatin (LIPITOR) 20 MG tablet Take 20 mg by mouth every evening.  . bisacodyl (DULCOLAX) 10 MG suppository Place 10 mg rectally daily as needed for moderate constipation.  . calcium-vitamin D (OSCAL WITH D) 500-200 MG-UNIT tablet Take 1 tablet by mouth daily with breakfast.  . diltiazem (CARDIZEM CD) 120 MG 24 hr capsule TAKE 1 CAPSULE BY MOUTH DAILY (Patient taking differently: 240mg  by mouth  once daily)  . docusate sodium (COLACE) 100 MG capsule Take 100 mg by mouth 2 (two) times daily.  . DULoxetine (CYMBALTA) 30 MG capsule Take 30 mg by mouth daily.  Marland Kitchen LORazepam (ATIVAN) 0.5 MG tablet Take 0.5 mg by mouth every evening.  . metFORMIN (GLUCOPHAGE) 500 MG tablet Take 500 mg by mouth daily with breakfast.    . Multiple Vitamin (MULTIVITAMIN) tablet Take 1 tablet by mouth daily.  . Multiple Vitamins-Minerals (ICAPS PO) Take 1 capsule by mouth 2 (two) times daily.  . ondansetron (ZOFRAN) 4 MG tablet Take 4 mg by mouth every 6 (six) hours as needed for nausea or vomiting.  Marland Kitchen oxyCODONE (ROXICODONE) 5 MG/5ML solution Take 5 mg by mouth every 6 (six) hours as needed for severe pain.  . pantoprazole (PROTONIX) 40 MG tablet Take 40 mg by mouth daily.  Marland Kitchen rOPINIRole (REQUIP) 0.25 MG tablet Take 0.5 mg by mouth at bedtime.     Allergies: Allergies as of 07/21/2017 - Review Complete 11/01/2016  Allergen Reaction Noted  . Other Itching and Rash 11/01/2016   Past Medical History:  Diagnosis Date  . Arrhythmia   . Arthritis   . Asthma   . Atrial fibrillation (HCC)   . Depression   . Diabetes mellitus without complication (HCC)   . Hyperlipidemia   . Hypertension   . RLS (restless legs syndrome)     Family History:   Family history reviewed, non-contributory.  Social History:  Social History   Tobacco Use  . Smoking status: Never Smoker  . Smokeless tobacco: Never Used  Substance Use Topics  . Alcohol use: No  . Drug use: No    Review of Systems: A complete ROS was negative except as per HPI.   Physical Exam: Blood pressure (!) 134/55, pulse 67, temperature 98.5 F (36.9 C), temperature source Oral, resp. rate 19, SpO2 95 %. Physical Exam  Constitutional: She is oriented to person, place, and time. She appears well-developed and well-nourished.  Non-toxic appearance. No distress.  HENT:  Head: Normocephalic and atraumatic.  Eyes: Conjunctivae are normal. No scleral icterus.  Neck: Normal range of motion. Neck supple. No JVD present.  Cardiovascular: Normal rate, regular rhythm and normal heart sounds.  Pulmonary/Chest: Effort normal and breath sounds normal.  Shallow breaths secondary to pain on inspiration  Abdominal: Soft. Bowel sounds are normal. There is tenderness in the  epigastric area. There is no rigidity and no guarding.  Numerous ecchymosis   Musculoskeletal: Normal range of motion. She exhibits no edema.  Neurological: She is alert and oriented to person, place, and time. No cranial nerve deficit.  Skin: Skin is warm and dry. She is not diaphoretic.  Psychiatric: She has a normal mood and affect.     EKG: personally reviewed my interpretation is normal sinus rhythm, HR 61  CXR: personally reviewed my interpretation is negative for focal opacity or pleural effusion, density behind heart concerning for large perforation  Assessment & Plan by Problem: Active Problems:   Esophageal anastomotic leak  Esophageal perforation  s/p recent laparoscopic paraesophageal hernia Patient hemodynamically stable and afebrile. Labs unremarkable. Imaging consistent with large perforation 2/2 to wound dehiscence following recent surgery. EDP consulted cardiothoracic surgery and reviewed films. No need for emergent surgical repair at this time.  -Monitor vitals -NPO -Continue Zosyn -Continue Vancomycin  -Pain control with morphine -Zofran PRN for nausea -LR 75 cc/hr  -Repeat CBC and CMP in AM -appreciate CT surgery recs  PAF Currently in NSR. Therapeutic INR  2.0.  -Cardiac monitoring -NPO holding diltiazem and atenolol -Will hold warfarin for now if patient to require surgery   Chronic Diastolic Congestive Heart Failure  Last echo 10/2016 demonstrated normal systolic function with estimated ejection fraction in the range of 60% to 65 with grade 2 diastolic dysfunction. Received 40 mg IV lasix in ED. Appears euvolemic on examination, lungs CTAB, no peripheral edema.   -I/Os -Will monitor volume status closely  -LR 75 cc/hr while NPO -Can consider Lasix PRN if concerned about hypervolemia   Type 2 DM  Most recent A1C in Care Everywhere 09/2016, 6.1. On metformin 500 mg daily only.  -CBG monitoring -NPO, no SSI, will adjust accordingly  -Ordered hemoglobin  A1C  HTN Mildly hypertensive, BP 134/55.  -Holding PO atenolol and diltiazem as NPO  Code Status: DNR, confirmed on admission  DVT ppx: SCDs   Dispo: Admit patient to Inpatient with expected length of stay greater than 2 midnights.  Signed: Toney Rakes, MD 07/22/2017, 12:38 PM  Pager: [phone]

## 2017-07-23 ENCOUNTER — Other Ambulatory Visit: Payer: Self-pay

## 2017-07-23 ENCOUNTER — Encounter (HOSPITAL_COMMUNITY): Payer: Self-pay | Admitting: *Deleted

## 2017-07-23 DIAGNOSIS — R112 Nausea with vomiting, unspecified: Secondary | ICD-10-CM

## 2017-07-23 DIAGNOSIS — K449 Diaphragmatic hernia without obstruction or gangrene: Secondary | ICD-10-CM

## 2017-07-23 DIAGNOSIS — I130 Hypertensive heart and chronic kidney disease with heart failure and stage 1 through stage 4 chronic kidney disease, or unspecified chronic kidney disease: Secondary | ICD-10-CM

## 2017-07-23 LAB — CBC
HCT: 37.1 % (ref 36.0–46.0)
Hemoglobin: 12 g/dL (ref 12.0–15.0)
MCH: 28 pg (ref 26.0–34.0)
MCHC: 32.3 g/dL (ref 30.0–36.0)
MCV: 86.7 fL (ref 78.0–100.0)
PLATELETS: 504 10*3/uL — AB (ref 150–400)
RBC: 4.28 MIL/uL (ref 3.87–5.11)
RDW: 14.1 % (ref 11.5–15.5)
WBC: 10.5 10*3/uL (ref 4.0–10.5)

## 2017-07-23 LAB — COMPREHENSIVE METABOLIC PANEL
ALBUMIN: 2.7 g/dL — AB (ref 3.5–5.0)
ALK PHOS: 85 U/L (ref 38–126)
ALT: 13 U/L — AB (ref 14–54)
AST: 15 U/L (ref 15–41)
Anion gap: 10 (ref 5–15)
BUN: 12 mg/dL (ref 6–20)
CALCIUM: 8.6 mg/dL — AB (ref 8.9–10.3)
CHLORIDE: 103 mmol/L (ref 101–111)
CO2: 25 mmol/L (ref 22–32)
CREATININE: 0.77 mg/dL (ref 0.44–1.00)
GFR calc Af Amer: >60 mL/min (ref >60)
GFR calc non Af Amer: >60 mL/min (ref >60)
GLUCOSE: 129 mg/dL — AB (ref 65–99)
Potassium: 3.7 mmol/L (ref 3.5–5.1)
SODIUM: 138 mmol/L (ref 135–145)
Total Bilirubin: 1.4 mg/dL — ABNORMAL HIGH (ref 0.3–1.2)
Total Protein: 6.9 g/dL (ref 6.5–8.1)

## 2017-07-23 LAB — GLUCOSE, CAPILLARY: GLUCOSE-CAPILLARY: 99 mg/dL (ref 65–99)

## 2017-07-23 MED ORDER — BOOST / RESOURCE BREEZE PO LIQD CUSTOM: 1.0000 | Freq: Three times a day (TID) | ORAL | Status: DC

## 2017-07-23 MED ORDER — ADULT MULTIVITAMIN W/MINERALS CH
1.0000 | ORAL_TABLET | Freq: Every day | ORAL | Status: DC
  Administered 2017-07-24: 1 via ORAL
  Filled 2017-07-23: qty 1

## 2017-07-23 MED ORDER — WARFARIN - PHARMACIST DOSING INPATIENT
Freq: Every day | Status: DC
  Administered 2017-07-23 – 2017-07-24 (×2)

## 2017-07-23 MED ORDER — BISACODYL 10 MG RE SUPP
10.0000 mg | Freq: Once | RECTAL | Status: AC
  Administered 2017-07-23: 10 mg via RECTAL
  Filled 2017-07-23: qty 1

## 2017-07-23 MED ORDER — WARFARIN SODIUM 3 MG PO TABS
3.0000 mg | ORAL_TABLET | Freq: Once | ORAL | Status: AC
  Administered 2017-07-23: 3 mg via ORAL
  Filled 2017-07-23: qty 1

## 2017-07-23 MED ORDER — PREMIER PROTEIN SHAKE
11.0000 [oz_av] | Freq: Two times a day (BID) | ORAL | Status: DC
  Administered 2017-07-23: 11 [oz_av] via ORAL
  Filled 2017-07-23 (×2): qty 325

## 2017-07-23 MED ORDER — WARFARIN SODIUM 4 MG PO TABS: 4.0000 mg | ORAL_TABLET | Freq: Once | ORAL | Status: DC

## 2017-07-23 NOTE — Progress Notes (Signed)
   Subjective:   Patient seen and examined. Patient states her pain is similar to the pain she experienced prior to her hernia repair. She describes progressively worsening decreased appetite and epigastric/lower chest pain. Per daughter they called the office at [facility] and were instructed to come to the emergency department. Currently, pain is well controlled. Patient would like to drink water.   Objective:  Vital signs in last 24 hours: Vitals:   07/22/17 1702 07/23/17 0441 07/23/17 0500 07/23/17 0900  BP: (!) 143/46 (!) 142/63  (!) 127/46  Pulse: 70 77    Resp:  16  18  Temp: 99.3 F (37.4 C) 98 F (36.7 C)  98.3 F (36.8 C)  TempSrc: Oral Oral  Oral  SpO2: 94% 95%  96%  Weight: 198 lb 10.2 oz (90.1 kg)  199 lb 8.3 oz (90.5 kg)   Height: 5\' 3"  (1.6 m)      General: Laying in bed comfortably, NAD HEENT: [place]/AT, EOMI, no scleral icterus, PERRL Cardiac: RRR, No R/M/G appreciated Pulm: normal effort, CTAB Abd: soft, non tender, non distended, BS normal Ext: extremities well perfused, no peripheral edema Neuro: alert and oriented X3, cranial nerves II-XII grossly intact   Assessment/Plan:  Active Problems:   Atrial fibrillation (HCC)   Diabetes mellitus, type 2 (HCC)   Essential hypertension   Esophageal anastomotic leak   Paraesophageal hiatal hernia  Recurrent paraesophageal hiatal hernia s/p recent laparoscopic repair  Patient hemodynamically stable and afebrile. Labs unremarkable. Initial imaging was concerning for large perforation 2/2 to wound dehiscence or infection following recent surgery. Per CT surgery the patient has recurrence of the hiatal hernia, and this is less likely perforation given hemodynamic stability. Spoke to general surgery, Dr. , and he aggrees that this is likely not a perforation, as the patient would be very ill. He recommends supportive care and getting the patient back to [facility] clinic for evaluation. There is no plan for  surgical intervention. Given the patient's description of symptoms and disease history of her pain, presentation does seem more consistent with recurrence of the hiatal hernia versus wound dehiscence or acute infectious process.  -Advance to clear liquid diet  -Continue Zosyn -Continue Vancomycin  -Pain control with morphine -Zofran PRN for nausea -LR 75 cc/hr - will reassess need if tolerates clear liquid diet   PAF Currently in NSR. Therapeutic INR 2.0.  -Cardiac monitoring -No surgical intervention, will resume Warfarin -Warfarin per pharmacy  -Advancing diet today, but will continue to hold diltiazem and atenolol     Chronic Diastolic Congestive Heart Failure  Last echo 10/2016 demonstrated normal systolic function with estimated ejection fraction in the range of 60% to 65 with grade 2 diastolic dysfunction. Received 40 mg IV lasix in ED. Appears euvolemic on examination. -I/Os -Will monitor volume status closely  -LR 75 cc/hr while NPO, will reassess need if patient tolerates clear liquid diet  -Can consider Lasix PRN if concerned about hypervolemia   Type 2 DM  Most recent A1C in Care Everywhere 09/2016, 6.1. On metformin 500 mg daily only.  -CBG monitoring -NPO, no SSI, will adjust accordingly  -Ordered hemoglobin A1C  HTN Mildly hypertensive, BP 134/55.  -Holding PO atenolol and diltiazem as NPO  Code Status: DNR, confirmed on admission  DVT ppx: SCDs, Will be restarted on Warfarin today    Dispo: Anticipated discharge in approximately 1-2 day(s).   10/2016, MD 07/23/2017, 11:01 AM Pager: [phone]

## 2017-07-23 NOTE — Progress Notes (Signed)
ANTICOAGULATION CONSULT NOTE - Initial Consult  Pharmacy Consult for Warfarin Indication: atrial fibrillation  Allergies  Allergen Reactions  . Codeine Nausea And Vomiting  . Dilaudid [Hydromorphone Hcl] Other (See Comments)    Pain, twitching  . Nitrofurantoin Hives, Itching and Dermatitis       . Other Itching and Rash    Antibiotic but she is not sure which one.  Maybe starts with a D for UTI  Other reaction(s): Rash Antibiotic but she is not sure which one.Maybe starts with a D for UTI     Patient Measurements: Height: 5\' 3"  (160 cm) Weight: 199 lb 8.3 oz (90.5 kg) IBW/kg (Calculated) : 52.4   Vital Signs: Temp: 98.3 F (36.8 C) (05/27 0900) Temp Source: Oral (05/27 0900) BP: 127/46 (05/27 0900) Pulse Rate: 77 (05/27 0441)  Labs: Recent Labs    07/22/17 0113 07/23/17 0246  HGB 11.5* 12.0  HCT 36.3 37.1  PLT 498* 504*  LABPROT 22.5*  --   INR 2.00  --   CREATININE 0.80 0.77    Estimated Creatinine Clearance: 54.9 mL/min (by C-G formula based on SCr of 0.77 mg/dL).   Medical History: Past Medical History:  Diagnosis Date  . Arrhythmia   . Arthritis   . Asthma   . Atrial fibrillation (HCC)   . Depression   . Diabetes mellitus without complication (HCC)   . History of hiatal hernia   . Hyperlipidemia   . Hypertension   . RLS (restless legs syndrome)     Assessment: 84 yo F with hx of atrial fibrillation (Chadsvasc = 4); pharmacy consulted for warfarin. No anticoagulation was on patient's med list, so I followed up with MD and she confirmed that patient on warfarin outpatient. I did a med rec with the daughter and updated the patient's PTA med list. INR therapeutic at 2.0 on 5/26. CBC stable with no signs/sx bleeding noted. Medical team advancing diet today.   PTA warfarin: 3 mg daily  Goal of Therapy:  INR 2-3 Monitor platelets by anticoagulation protocol: Yes   Plan:  Warfarin 3 mg PO x 1 Daily INR, CBCs F/u s/sx bleeding, PO intake,  DDI  6/26, PharmD PGY1 Acute Care Pharmacy Resident 07/23/2017,11:42 AM

## 2017-07-23 NOTE — Progress Notes (Signed)
Initial Nutrition Assessment  DOCUMENTATION CODES:   Obesity unspecified  INTERVENTION:   -Premier Protein BID, each supplement provides 160 kcals and 30 grams protein -MVI with minerals daily  NUTRITION DIAGNOSIS:   Inadequate oral intake related to altered GI function as evidenced by per patient/family report, percent weight loss.  GOAL:   Patient will meet greater than or equal to 90% of their needs  MONITOR:   PO intake, Supplement acceptance, Labs, Weight trends, Skin, I & O's  REASON FOR ASSESSMENT:   Malnutrition Screening Tool    ASSESSMENT:   84 yo female with PMHx of paroxysmal atrial fibrillation on warfarin, obstructive sleep apnea, type 2 DM, HLD, hypertension, rheumatoid arthritis, hiatal hernia, restless leg syndrome, and depression presenting with abdominal pain, nausea, and generalized weakness  Pt admitted with abdominal pain, nausea, vomiting, and generalized weakness. Per CTS notes, unlikely esophageal perforation or leak (pt s/p recent repair of paraesophageal hernia). Plan to consult general surgery for further recommendations.   Spoke with pt at bedside, who reports she has lost approximately 40# since her initial procedure for paraesophageal hernia in September 2018. She reports that she was on prolonged periods of clear and full liquids pre and post-op, but was ultimately able to return back to soft solid foods. She shares that as of her last surgery (07/06/17), she had recently progressed to soft foods again as of last week, however, experienced pain and sensation of food being stuck in her esophagus. Commonly consumed foods included mashed green beans, scrambled eggs, boiled eggs, carrots, mashed potatoes, and tuna fish. She was also consuming two Premier Protein supplements daily (pt does not tolerate Ensure products well).   Pt consuming ice chips at time of visit without difficulty and is eager to try liquids. Discussed with pt what clear liquids  comprised of; she is also amenable to [facility] with clear liquids and to resume Premier Protein with diet advancement. (ADDENDUM: pt advanced to regular diet after RD visit).   Labs reviewed.   NUTRITION - FOCUSED PHYSICAL EXAM:    Most Recent Value  Orbital Region  No depletion  Upper Arm Region  Mild depletion  Thoracic and Lumbar Region  No depletion  Buccal Region  No depletion  Temple Region  No depletion  Clavicle Bone Region  No depletion  Clavicle and Acromion Bone Region  No depletion  Scapular Bone Region  No depletion  Dorsal Hand  Mild depletion  Patellar Region  No depletion  Anterior Thigh Region  No depletion  Posterior Calf Region  No depletion  Edema (RD Assessment)  None  Hair  Reviewed  Eyes  Reviewed  Mouth  Reviewed  Skin  Reviewed  Nails  Reviewed       Diet Order:   Diet Order           Diet regular Room service appropriate? Yes; Fluid consistency: Thin  Diet effective now          EDUCATION NEEDS:   Education needs have been addressed  Skin:  Skin Assessment: Reviewed RN Assessment  Last BM:  07/23/17  Height:   Ht Readings from Last 1 Encounters:  07/22/17 5\' 3"  (1.6 m)    Weight:   Wt Readings from Last 1 Encounters:  07/23/17 199 lb 8.3 oz (90.5 kg)    Ideal Body Weight:  52.3 kg  BMI:  Body mass index is 35.34 kg/m.  Estimated Nutritional Needs:   Kcal:  1600-1800  Protein:  105-120 grams  Fluid:  1.6-1.8 L    Danylle Ouk A. Jimmye Norman, RD, LDN, CDE Pager: [phone] After hours Pager: [phone]

## 2017-07-24 DIAGNOSIS — E876 Hypokalemia: Secondary | ICD-10-CM

## 2017-07-24 DIAGNOSIS — Z66 Do not resuscitate: Secondary | ICD-10-CM

## 2017-07-24 DIAGNOSIS — I480 Paroxysmal atrial fibrillation: Secondary | ICD-10-CM

## 2017-07-24 DIAGNOSIS — K449 Diaphragmatic hernia without obstruction or gangrene: Secondary | ICD-10-CM

## 2017-07-24 DIAGNOSIS — Z9889 Other specified postprocedural states: Secondary | ICD-10-CM

## 2017-07-24 DIAGNOSIS — I5032 Chronic diastolic (congestive) heart failure: Secondary | ICD-10-CM

## 2017-07-24 DIAGNOSIS — I110 Hypertensive heart disease with heart failure: Secondary | ICD-10-CM

## 2017-07-24 DIAGNOSIS — E119 Type 2 diabetes mellitus without complications: Secondary | ICD-10-CM

## 2017-07-24 DIAGNOSIS — Z7901 Long term (current) use of anticoagulants: Secondary | ICD-10-CM

## 2017-07-24 LAB — CBC
HEMATOCRIT: 35.5 % — AB (ref 36.0–46.0)
Hemoglobin: 11.3 g/dL — ABNORMAL LOW (ref 12.0–15.0)
MCH: 27.5 pg (ref 26.0–34.0)
MCHC: 31.8 g/dL (ref 30.0–36.0)
MCV: 86.4 fL (ref 78.0–100.0)
Platelets: 516 10*3/uL — ABNORMAL HIGH (ref 150–400)
RBC: 4.11 MIL/uL (ref 3.87–5.11)
RDW: 14 % (ref 11.5–15.5)
WBC: 9.6 10*3/uL (ref 4.0–10.5)

## 2017-07-24 LAB — BASIC METABOLIC PANEL
ANION GAP: 10 (ref 5–15)
BUN: 10 mg/dL (ref 6–20)
CHLORIDE: 99 mmol/L — AB (ref 101–111)
CO2: 26 mmol/L (ref 22–32)
Calcium: 8.4 mg/dL — ABNORMAL LOW (ref 8.9–10.3)
Creatinine, Ser: 0.87 mg/dL (ref 0.44–1.00)
GFR calc Af Amer: >60 mL/min (ref >60)
GFR calc non Af Amer: 59 mL/min — ABNORMAL LOW (ref >60)
GLUCOSE: 118 mg/dL — AB (ref 65–99)
POTASSIUM: 3.2 mmol/L — AB (ref 3.5–5.1)
Sodium: 135 mmol/L (ref 135–145)

## 2017-07-24 LAB — GLUCOSE, CAPILLARY
GLUCOSE-CAPILLARY: 117 mg/dL — AB (ref 65–99)
Glucose-Capillary: 123 mg/dL — ABNORMAL HIGH (ref 65–99)

## 2017-07-24 LAB — PROTIME-INR
INR: 2.44
Prothrombin Time: 26.3 seconds — ABNORMAL HIGH (ref 11.4–15.2)

## 2017-07-24 MED ORDER — WARFARIN SODIUM 3 MG PO TABS
3.0000 mg | ORAL_TABLET | Freq: Once | ORAL | Status: DC
  Filled 2017-07-24: qty 1

## 2017-07-24 MED ORDER — ENSURE ENLIVE PO LIQD: 237.0000 mL | Freq: Three times a day (TID) | ORAL | Status: DC

## 2017-07-24 MED ORDER — BOOST / RESOURCE BREEZE PO LIQD CUSTOM
1.0000 | Freq: Three times a day (TID) | ORAL | Status: DC
  Administered 2017-07-24: 1 via ORAL

## 2017-07-24 MED ORDER — POTASSIUM CHLORIDE 10 MEQ/100ML IV SOLN
10.0000 meq | INTRAVENOUS | Status: AC
  Administered 2017-07-24 (×4): 10 meq via INTRAVENOUS
  Filled 2017-07-24 (×4): qty 100

## 2017-07-24 MED ORDER — ATENOLOL 50 MG PO TABS
100.0000 mg | ORAL_TABLET | Freq: Every day | ORAL | Status: DC
Start: 2017-07-24 — End: 2017-07-24
  Administered 2017-07-24: 100 mg via ORAL
  Filled 2017-07-24: qty 2

## 2017-07-24 MED ORDER — WHITE PETROLATUM EX OINT
TOPICAL_OINTMENT | CUTANEOUS | Status: AC
  Administered 2017-07-24: 0.2
  Filled 2017-07-24: qty 28.35

## 2017-07-24 MED ORDER — DILTIAZEM HCL ER COATED BEADS 240 MG PO CP24
240.0000 mg | ORAL_CAPSULE | Freq: Every day | ORAL | Status: DC
  Administered 2017-07-24: 240 mg via ORAL
  Filled 2017-07-24: qty 1

## 2017-07-24 NOTE — Progress Notes (Signed)
Pt continues to have difficulty swallowing medicines.  Chest pain increases when taking po meds.   Morphine given prn for pain.

## 2017-07-24 NOTE — Progress Notes (Signed)
RN s/w Osborne Casco CM about possible transfer to [facility].  She called her Interior and spatial designer and two agencies information given on flying pt to [place].  Med [employer] and [employer].  Both agencies called and information given with pt and daughters permission.  Received a quote on transportation from [employer].  Quote given to daughter. Information received from "Rob" at [facility] suggests that Medicare alone will not preapprove a flight.  Information also given to daughter and pt.  Pt ans daughter both agree that they can manage the payment if needed.  Encouraged pt to wait on both quotes and to let companies to attempt to verify with insurance company.  MD informed of the process and agrees that pt still needs transport.  Bed assignment and attending noted prior to end of shift. Family member states she is available until 8pm to sign paperwork but will leave for [place] at that point.

## 2017-07-24 NOTE — Evaluation (Signed)
Physical Therapy Evaluation Patient Details Name: Michelle Douglas MRN: [id number] DOB: 1930-01-29 Today's Date: 07/24/2017   History of Present Illness  84 yo female with PMHx of paroxysmal atrial fibrillation on warfarin, obstructive sleep apnea, type 2 DM, HLD, hypertension, rheumatoid arthritis, hiatal hernia, restless leg syndrome, and depression presenting with abdominal pain, nausea, and generalized weakness. On 07/05/2017 the patient underwent laparoscopic repair of recurrent paraesophageal hernia and laparoscopic takedown of previous fundoplication. Pt admitted 07/21/2017, due to epigastric abdominal pain/lower chest pain associated with low-grade fevers (T max 99), nausea, decreased p.o. Intake/decreased appetite/aversion to food, and one episode of vomiting.   Clinical Impression  Orders received for PT evaluation. Patient demonstrates deficits in functional mobility as indicated below. Will benefit from continued skilled PT to address deficits and maximize function. Will see as indicated and progress as tolerated.   Encouraged continued mobility with staff  Follow Up Recommendations Home health PT    Equipment Recommendations  None recommended by PT    Recommendations for Other Services       Precautions / Restrictions Restrictions Weight Bearing Restrictions: No      Mobility  Bed Mobility Overal bed mobility: Modified Independent             General bed mobility comments: HOB slightly inclined, used bed rails to pull up  Transfers Overall transfer level: Needs assistance Equipment used: None Transfers: Sit to/from Stand Sit to Stand: Supervision         General transfer comment: No physical assist required  Ambulation/Gait Ambulation/Gait assistance: Supervision Ambulation Distance (Feet): 210 Feet Assistive device: None Gait Pattern/deviations: Step-through pattern;Decreased stride length Gait velocity: decreased   General Gait Details: slow cadence but  overall steady with ambulation, no overt LOB at this time  Stairs            Wheelchair Mobility    Modified Rankin (Stroke Patients Only)       Balance Overall balance assessment: No apparent balance deficits (not formally assessed)                                           Pertinent Vitals/Pain Pain Assessment: No/denies pain    Home Living Family/patient expects to be discharged to:: Private residence Living Arrangements: Alone Available Help at Discharge: Family;Personal care attendant;Available 24 hours/day Type of Home: House         Home Equipment: Walker - 2 wheels;Shower seat;Grab bars - tub/shower      Prior Function Level of Independence: Needs assistance      ADL's / Homemaking Assistance Needed: 24hr nurse available assists with cooking, cleaning, transfer in/out of shower secondary to pt limited by pain.   Comments: Per pt and daughter, prior to hiatal hernia, pt was independent in all areas. s/p recent laparascopic repair pt has caregiver available 24/7 to assist with home management and ADLs.        Hand Dominance   Dominant Hand: Right    Extremity/Trunk Assessment   Upper Extremity Assessment Upper Extremity Assessment: Generalized weakness    Lower Extremity Assessment Lower Extremity Assessment: Generalized weakness    Cervical / Trunk Assessment Cervical / Trunk Assessment: Normal  Communication   Communication: No difficulties  Cognition Arousal/Alertness: Awake/alert Behavior During Therapy: WFL for tasks assessed/performed Overall Cognitive Status: Within Functional Limits for tasks assessed  General Comments: Pt expressed frustration "I can't go through this again" when MD arrived to discuss possibility of transfer.       General Comments      Exercises     Assessment/Plan    PT Assessment Patient needs continued PT services  PT Problem List Decreased  strength;Decreased activity tolerance;Decreased mobility       PT Treatment Interventions DME instruction;Gait training;Therapeutic activities;Therapeutic exercise;Balance training;Functional mobility training;Patient/family education    PT Goals (Current goals can be found in the Care Plan section)  Acute Rehab PT Goals Patient Stated Goal: to get stronger PT Goal Formulation: With patient/family Time For Goal Achievement: 08/07/17 Potential to Achieve Goals: Fair    Frequency Min 3X/week   Barriers to discharge        Co-evaluation               AM-PAC PT "6 Clicks" Daily Activity  Outcome Measure Difficulty turning over in bed (including adjusting bedclothes, sheets and blankets)?: A Little Difficulty moving from lying on back to sitting on the side of the bed? : A Little Difficulty sitting down on and standing up from a chair with arms (e.g., wheelchair, bedside commode, etc,.)?: A Little Help needed moving to and from a bed to chair (including a wheelchair)?: A Little Help needed walking in hospital room?: A Little Help needed climbing 3-5 steps with a railing? : A Little 6 Click Score: 18    End of Session Equipment Utilized During Treatment: Gait belt Activity Tolerance: Patient tolerated treatment well Patient left: in bed;with call bell/phone within reach;with family/visitor present Nurse Communication: Mobility status PT Visit Diagnosis: Muscle weakness (generalized) (M62.81)    Time: 5329-9242 PT Time Calculation (min) (ACUTE ONLY): 21 min   Charges:   PT Evaluation $PT Eval Moderate Complexity: 1 Mod     PT G Codes:        Charlotte Crumb, PT DPT  Board Certified Neurologic Specialist [phone]   Fabio Asa 07/24/2017, 5:28 PM

## 2017-07-24 NOTE — Progress Notes (Signed)
   Subjective:  Patient continuing to have lower chest pain with eating. Was able to tolerate clears and some food yesterday, but had an episode of vomiting after taking a multivitamin this AM. She states that eating and drinking all foods cause pain. She states water even causes her to have pain.   Objective:  Vital signs in last 24 hours: Vitals:   07/23/17 1354 07/23/17 2106 07/24/17 0621 07/24/17 0800  BP: (!) 163/65 (!) 139/54 (!) 126/55 (!) 153/70  Pulse: 77 78 78 80  Resp: 18 20 20    Temp: 99.4 F (37.4 C) 99.3 F (37.4 C) 99.2 F (37.3 C)   TempSrc: Oral Oral Oral   SpO2: 96% 97% 92% 96%  Weight:   201 lb 15.1 oz (91.6 kg)   Height:       General: Laying in bed comfortably, NAD HEENT: [place]/AT, EOMI Cardiac: RRR, No R/M/G appreciated Pulm: normal effort, CTAB Abd: soft, non tender, non distended, BS normal Ext: extremities well perfused, no peripheral edema Neuro: alert and oriented X3, cranial nerves II-XII grossly intact   Assessment/Plan:  Active Problems:   Atrial fibrillation (HCC)   Diabetes mellitus, type 2 (HCC)   Essential hypertension   Esophageal anastomotic leak   Paraesophageal hiatal hernia  Recurrent paraesophageal hiatal hernia s/p recent laparoscopic repair  Patient hemodynamically stable and afebrile. No surgical intervention at this time and general surgery recommends the patient return to [facility]. Odynophagia consistent with recurrence of hiatal hernia.  -Will contact [facility] today  -Soft diet  -Disontinue Zosyn -Disontinue Vancomycin  -Pain control with morphine -Zofran PRN for nausea -LR 50 cc/hr  Hypokalemia K 3.4 this AM.  -Replacing as needed   PAF Currently in NSR. Therapeutic INR. -Cardiac monitoring -Warfarin per pharmacy  -Will restart diltiazem and atenolol and see how patient tolerates    Chronic Diastolic Congestive Heart Failure  Last echo 10/2016 demonstrated normal systolic function with estimated  ejection fraction in the range of 60% to 65 with grade 2 diastolic dysfunction. Appears euvolemic on examination. -I/Os -Will monitor volume status closely  -Will continue LR 50 cc/hr -Can consider Lasix PRN if concerned about hypervolemia   Type 2 DM  Hemoglobin A1C 5.7. Currently meeting inpatient goals.  -CBG monitoring  HTN Mildly hypertensive, BP 153/70.  -Resuming atenolol and diltiazem   Code Status: DNR, confirmed on admission  DVT ppx: Warfarin    Dispo: Anticipated discharge in approximately 0-1 days.   11/2016, MD 07/24/2017, 11:05 AM Pager: [phone]

## 2017-07-24 NOTE — Progress Notes (Signed)
Received call from patient's surgeon Dr. Lavone Neri at [facility]. He reveiwed the radiology reports of the esophagram and CT abdomen/pelvis that I faxed to his office. He is concerned that the patient has recurrence of her hernia, as well as possible wound dehiscence. He recommends transfer to [facility] as soon as possible for evaluation and likely surgical repair.   Plan: -Coordnating with [facility] admissions and bed control for transfer (Contact # [phone]) -Patient and family are in agreement with transfer -At this time, she remains hemodynamically stable for transfer   Juluis Mire, MD Internal Medicine PGY1 Pager # [phone]

## 2017-07-24 NOTE — Progress Notes (Signed)
  Date: 07/24/2017  Patient name: Michelle Douglas  Medical record number: [id number]  Date of birth: October 02, 1929   I have seen and evaluated this patient and I have discussed the plan of care with the house staff. Please see their note for complete details. I concur with their findings with the following additions/corrections:   Unfortunately, continues to have substernal chest pain after eating and drinking, nothing goes down without pain. Vomited after taking a multivitamin this morning, unable to eat breakfast. Per review by CT surgery, no sign of rupture, symptoms appear to have resulted from recurrence of hernia. Dr. Minda Meo has reached out to [facility], discussing with her team there as well as her and her family about next steps. She does not want another surgery, so options may be limited. In the meantime, will try to encourage nutrition as best we can and minimize pill burden.   Jessy Oto, M.D., Ph.D. 07/24/2017, 4:08 PM

## 2017-07-24 NOTE — Discharge Summary (Addendum)
Name: Michelle Douglas MRN: [id number] DOB: 23-Aug-1929 84 y.o. PCP: Concepcion Elk, MD  Date of Admission: 07/21/2017 11:39 PM Date of Discharge: 07/25/17 Attending Physician: Oda Kilts, MD  Discharge Diagnosis: 1. Complication of Paraesophageal Hiatal Hernia Repair  Principal Problem:   Paraesophageal hiatal hernia Active Problems:   Atrial fibrillation (HCC)   Diabetes mellitus, type 2 (HCC)   Essential hypertension   Discharge Medications: Allergies as of 07/24/2017      Reactions   Codeine Nausea And Vomiting   Dilaudid [hydromorphone Hcl] Other (See Comments)   Pain, twitching   Nitrofurantoin Hives, Itching, Dermatitis      Other Itching, Rash   Antibiotic but she is not sure which one.  Maybe starts with a D for UTI  Other reaction(s): Rash Antibiotic but she is not sure which one.Maybe starts with a D for UTI       Medication List    TAKE these medications   acetaminophen 160 MG/5ML suspension Commonly known as:  TYLENOL Take 160 mg by mouth every 6 (six) hours as needed for mild pain.   albuterol 108 (90 Base) MCG/ACT inhaler Commonly known as:  PROVENTIL HFA;VENTOLIN HFA Inhale 1-2 puffs into the lungs every 6 (six) hours as needed for wheezing or shortness of breath.   atenolol 100 MG tablet Commonly known as:  TENORMIN Take 1 tablet (100 mg total) by mouth daily.   atorvastatin 20 MG tablet Commonly known as:  LIPITOR Take 20 mg by mouth every evening.   bisacodyl 10 MG suppository Commonly known as:  DULCOLAX Place 10 mg rectally daily as needed for moderate constipation.   calcium-vitamin D 500-200 MG-UNIT tablet Commonly known as:  OSCAL WITH D Take 1 tablet by mouth daily with breakfast.   diltiazem 120 MG 24 hr capsule Commonly known as:  CARDIZEM CD TAKE 1 CAPSULE BY MOUTH DAILY What changed:    how much to take  how to take this  when to take this   docusate sodium 100 MG capsule Commonly known as:  COLACE Take 100 mg by  mouth 2 (two) times daily.   DULoxetine 30 MG capsule Commonly known as:  CYMBALTA Take 30 mg by mouth daily.   ICAPS PO Take 1 capsule by mouth 2 (two) times daily.   LORazepam 0.5 MG tablet Commonly known as:  ATIVAN Take 0.5 mg by mouth every evening.   metFORMIN 500 MG tablet Commonly known as:  GLUCOPHAGE Take 500 mg by mouth daily with breakfast.   multivitamin tablet Take 1 tablet by mouth daily.   ondansetron 4 MG tablet Commonly known as:  ZOFRAN Take 4 mg by mouth every 6 (six) hours as needed for nausea or vomiting.   oxyCODONE 5 MG/5ML solution Commonly known as:  ROXICODONE Take 5 mg by mouth every 6 (six) hours as needed for severe pain.   pantoprazole 40 MG tablet Commonly known as:  PROTONIX Take 40 mg by mouth daily.   Promethazine HCl 6.25 MG/5ML Soln Take 20 mLs by mouth every 6 (six) hours as needed (nausea/vomiting).   rOPINIRole 0.25 MG tablet Commonly known as:  REQUIP Take 0.5 mg by mouth at bedtime.   warfarin 3 MG tablet Commonly known as:  COUMADIN Take 3 mg by mouth daily at 6 PM.       Disposition and follow-up:   Michelle Douglas was discharged from [facility] in Stable condition.  At the hospital follow up visit please address:  1.  Complication of Esophageal Hernia Repair -Transfer to [facility] to be evalauted by surgeon Dr. Magnus Ivan   Paroxysmal Atrial Fibrillation  -Recommend admission EKG and continuous cardiac monitoring -Resume Warfarin, Atenolol, and Diltiazem when patient able to tolerate PO   -Patient was in normal sinus rhythm at discharge   Type 2 Diabetes, Well Controlled -Recommend CBG monitoring and SSI for blood glucose management  2.  Labs / imaging needed at time of follow-up: BMP, CBC  3.  Pending labs/ test needing follow-up: none  Follow-up Appointments:   Hospital Course by problem list: Principal Problem:   Paraesophageal hiatal hernia Active Problems:   Atrial  fibrillation ([place])   Diabetes mellitus, type 2 (HCC)   Essential hypertension   1. Complication of Esophageal Hernia Repair Michelle Douglas presented to [facility] emergency department with several day of abdominal pain/odynophagia, nausea, food aversion, and vomiting. Prior to her presentation, the patient had undergone laparoscopic repair of recurrent paraesophageal hernia and laparoscopic takedown of previous fundoplication at [facility] on 07/05/2017. She had no immediate post-operative complication at that time and was discharged to a skilled nursing facility in [place]. She returned home and was tolerating a soft diet unti five days prior to admission when she began having worsening odynophagia, nausea, and vomiting.   Upon arrival to [facility] ED, the patient was hemodynamically stable. Vital signs were within normal limits and she was afebrile. Labs were remarkable for a mildly elevated leukocytosis to 13,000. Initial imaging, including an esophagram and CT Abdomen/Pelvis were concerning for an esophageal perforation or leak and Cardiothoracic Surgery was consulted. She was empirically started on Vancomycin and Zosyn. No emergent surgical intervention was pursued. General surgery was also consulted and recommended supportive care until transfer to [facility] could be facilitated. The patient's surgeon Dr. Judeth Porch was contacted and also recommended transfer to [facility]. The patient was hemodynamically stable for discharge.   2. Paroxysmal Atrial Fibrillation  Patient remained in normal sinus rhythm duration of hospitalization and was monitored with telemetry. Atenolol and Diltiazem were held during hospitalization due to inability to tolerate PO. Warfarin was resumed. Resume patient's medications pending tolerability of PO intake.   3. Type 2 Diabetes Mellitus  Patient met inpatient goals for blood glucose levels. Managed with SSI.   Discharge Vitals:   BP (!)  145/61 (BP Location: Right Arm)   Pulse 90   Temp 98.2 F (36.8 C) (Oral)   Resp 19   Ht _0  (1.6 m)   Wt 201 lb 15.1 oz (91.6 kg)   SpO2 96%   BMI 35.77 kg/m   Pertinent Labs, Studies, and Procedures:  CBC Latest Ref Rng & Units 07/24/2017 07/23/2017 07/22/2017  WBC 4.0 - 10.5 K/uL 9.6 10.5 13.9(H)  Hemoglobin 12.0 - 15.0 g/dL 11.3(L) 12.0 11.5(L)  Hematocrit 36.0 - 46.0 % 35.5(L) 37.1 36.3  Platelets 150 - 400 K/uL 516(H) 504(H) 498(H)   BMP Latest Ref Rng & Units 07/24/2017 07/23/2017 07/22/2017  Glucose 65 - 99 mg/dL 118(H) 129(H) 151(H)  BUN 6 - 20 mg/dL _1 Creatinine 0.44 - 1.00 mg/dL 0.87 0.77 0.80  Sodium 135 - 145 mmol/L 135 138 134(L)  Potassium 3.5 - 5.1 mmol/L 3.2(L) 3.7 3.9  Chloride 101 - 111 mmol/L 99(L) 103 103  CO2 22 - 32 mmol/L _2 Calcium 8.9 - 10.3 mg/dL 8.4(L) 8.6(L) 8.5(L)   EXAM: ESOPHOGRAM/BARIUM SWALLOW  TECHNIQUE: Single contrast examination was performed using  water-soluble.  FLUOROSCOPY  TIME:  Fluoroscopy Time:  1 minutes 20 seconds  Radiation Exposure Index (if provided by the fluoroscopic device):  Number of Acquired Spot Images: 7  COMPARISON:  CT AP 07/22/2017.  FINDINGS: The patient ingested water-soluble contrast material. The proximal esophagus appears increased in caliber and patulous. There is beak like narrowing of the distal esophagus to the level of the diaphragmatic hiatus. Contrast can be seen passing through the distal esophagus and into the stomach. Additionally, active extravasation from the right side of the distal esophagus can be seen. The extravasated contrast material appears to fill a large fluid collection which in retrospect was present on CT from earlier today.  IMPRESSION: 1. Examination is positive for extravasation of water-soluble contrast material from the distal esophagus compatible with dehiscence of suture line within the distal esophagus. The contrast fills a large cavity  surrounding the distal esophagus which is present on CT from earlier today.  EXAM: CT ABDOMEN AND PELVIS WITH CONTRAST  TECHNIQUE: Multidetector CT imaging of the abdomen and pelvis was performed using the standard protocol following bolus administration of intravenous contrast.  CONTRAST:  139m OMNIPAQUE IOHEXOL 300 MG/ML  SOLN  COMPARISON:  CT 12/27/2016, 11/28/2016, 09/29/2016  FINDINGS: Lower chest: Small pleural effusions and passive atelectasis at the bases. Cardiomegaly. Moderate large hiatal hernia, with new surgical sutures within the herniated portion of the stomach as well as along the portion of stomach within the abdominal cavity. Abnormal fluid and edema within the hernia sac. Herniated stomach appears thickened. No extraluminal gas.  Hepatobiliary: No focal liver abnormality is seen. No gallstones, gallbladder wall thickening, or biliary dilatation.  Pancreas: Unremarkable. No pancreatic ductal dilatation or surrounding inflammatory changes.  Spleen: Normal in size without focal abnormality.  Adrenals/Urinary Tract: Adrenal glands are unremarkable. Kidneys are normal, without renal calculi, focal lesion, or hydronephrosis. Bladder is unremarkable.  Stomach/Bowel: Stomach is within normal limits. Appendix appears normal. No evidence of bowel wall thickening, distention, or inflammatory changes.  Vascular/Lymphatic: Nonaneurysmal aorta. Mild to moderate aortic atherosclerosis. No significantly enlarged lymph nodes  Reproductive: Status post hysterectomy. No adnexal masses.  Other: Negative for free air or free fluid. Small fat in the umbilical region. Tiny subcutaneous nodules, some of which are decrease.  Musculoskeletal: Trace retrolisthesis of L5 on S1 with trace anterolisthesis of L4 on L5. Trace retrolisthesis L2 on L3. Degenerative changes. No acute or suspicious abnormality.  IMPRESSION: 1. Residual moderate to large hiatal  hernia, now with new surgical sutures at the herniated stomach and intra-abdominal portion of the stomach. Abnormal fluid and edema within the hernia sac with suspected gastric wall thickening of the herniated stomach. No lower mediastinal air 2. Small pleural effusions and passive atelectasis at the bases 3. There are otherwise no additional acute intra-abdominal or pelvic abnormality seen.   Discharge Instructions: Discharge Instructions    Call MD for:   Complete by:  As directed    Call MD for:  persistant nausea and vomiting   Complete by:  As directed    Call MD for:  severe uncontrolled pain   Complete by:  As directed    Call MD for:  temperature >100.4   Complete by:  As directed    Diet - low sodium heart healthy   Complete by:  As directed    Increase activity slowly   Complete by:  As directed       Signed: NThomasene Ripple MD 07/24/2017, 6:39 PM   Pager: [phone] Internal Medicine Attending Note:  I saw  and examined the patient on the day of discharge. I reviewed and agree with the discharge summary written by the house staff.  Admitted for worsening substernal chest pain and vomiting after recent paraesophageal hernia repair at [facility]. Imaging concerning for anastomotic leak. Evaluated by CT surgery, who did not think leak was likely based on appearance. Discussed with her surgeon in [place], who was concerned by imaging for possible leak, recommended transfer back to [facility], which was arranged. At the time of transfer she was afebrile with reassuring vitals and exam.  Lenice Pressman, M.D., Ph.D.

## 2017-07-24 NOTE — Progress Notes (Signed)
Occupational Therapy Evaluation Patient Details Name: Michelle Douglas MRN: [id number] DOB: 11/22/1929 Today's Date: 07/24/2017    History of Present Illness 84 yo female with PMHx of paroxysmal atrial fibrillation on warfarin, obstructive sleep apnea, type 2 DM, HLD, hypertension, rheumatoid arthritis, hiatal hernia, restless leg syndrome, and depression presenting with abdominal pain, nausea, and generalized weakness. On 07/05/2017 the patient underwent laparoscopic repair of recurrent paraesophageal hernia and laparoscopic takedown of previous fundoplication at [facility]. This was her second hiatal hernia repair, previous surgery took place in 10/2016. The patient tolerated the procedure well and was discharged on 07/09/2017.  Post-operatively, she had been tolerating clears and advanced to soft diet without any issues. Pt admitted 07/21/2017, due to epigastric abdominal pain/lower chest pain associated with low-grade fevers (T max 99), nausea, decreased p.o. Intake/decreased appetite/aversion to food, and one episode of vomiting.    Clinical Impression   PTA, pt was living at home with 24hr care to assist with home management and ADLs secondary to hiatal hernia repair. Prior to hiatal hernia, pt was very active in the community and independent in all areas. Pt currently limited by pain and requires supervision for ADLs. Due to decline in current level of function, pt would benefit from acute OT to address establish goals to facilitate safe D/C home. Pt plans to continue personal care at home until she returns to baseline. At this time, recommend HHOT follow-up.      Follow Up Recommendations  Home health OT;Supervision/Assistance - 24 hour    Equipment Recommendations  None recommended by OT    Recommendations for Other Services PT consult     Precautions / Restrictions Restrictions Weight Bearing Restrictions: No      Mobility Bed Mobility Overal bed mobility: Modified Independent              General bed mobility comments: HOB slightly inclined, used bed rails to pull up  Transfers Overall transfer level: Needs assistance Equipment used: Rolling walker (2 wheeled) Transfers: Sit to/from Stand           General transfer comment: pt expressed desire to walk around, encouraged pt to walk with staff.     Balance Overall balance assessment: No apparent balance deficits (not formally assessed)                                         ADL either performed or assessed with clinical judgement   ADL Overall ADL's : Needs assistance/impaired Eating/Feeding: Set up(difficulty with intake )   Grooming: Oral care;Supervision/safety;Standing   Upper Body Bathing: Supervision/ safety;Sitting   Lower Body Bathing: Sit to/from stand;Supervison/ safety   Upper Body Dressing : Set up   Lower Body Dressing: Supervision/safety;Sit to/from stand   Toilet Transfer: Grab bars;RW;Min guard   Toileting- Clothing Manipulation and Hygiene: Sit to/from stand;Min guard       Functional mobility during ADLs: Supervision/safety;Rolling walker General ADL Comments: Discussed importance of ambulating to toilet to improve strength. Pt plans to keep 24hr care at home until "she is stronger" ( )     Vision Baseline Vision/History: Wears glasses Wears Glasses: Reading only Patient Visual Report: No change from baseline       Perception     Praxis      Pertinent Vitals/Pain Pain Assessment: No/denies pain     Hand Dominance Right   Extremity/Trunk Assessment Upper Extremity Assessment Upper Extremity Assessment: Generalized  weakness   Lower Extremity Assessment Lower Extremity Assessment: Defer to PT evaluation   Cervical / Trunk Assessment Cervical / Trunk Assessment: Normal   Communication Communication Communication: No difficulties   Cognition Arousal/Alertness: Awake/alert Behavior During Therapy: WFL for tasks  assessed/performed Overall Cognitive Status: Within Functional Limits for tasks assessed                                 General Comments: Pt expressed frustration "I can't go through this again" when MD arrived to discuss possibility of transfer.    General Comments       Exercises     Shoulder Instructions      Home Living Family/patient expects to be discharged to:: Private residence Living Arrangements: Alone Available Help at Discharge: Family;Personal care attendant;Available 24 hours/day Type of Home: House             Bathroom Shower/Tub: Walk-in shower   Bathroom Toilet: Handicapped height     Home Equipment: Environmental consultant - 2 wheels;Shower seat;Grab bars - tub/shower          Prior Functioning/Environment Level of Independence: Needs assistance    ADL's / Homemaking Assistance Needed: 24hr nurse available assists with cooking, cleaning, transfer in/out of shower secondary to pt limited by pain.    Comments: Per pt and daughter, prior to hiatal hernia, pt was independent in all areas. s/p recent laparascopic repair pt has caregiver available 24/7 to assist with home management and ADLs.           OT Problem List: Decreased strength;Decreased activity tolerance      OT Treatment/Interventions: Self-care/ADL training;Energy conservation;Therapeutic exercise;Therapeutic activities;Patient/family education;Balance training    OT Goals(Current goals can be found in the care plan section) Acute Rehab OT Goals Patient Stated Goal: to get stronger OT Goal Formulation: With patient Time For Goal Achievement: 08/07/17 Potential to Achieve Goals: Good ADL Goals Pt Will Perform Lower Body Bathing: with modified independence;sit to/from stand Pt Will Perform Lower Body Dressing: Independently;sit to/from stand Pt Will Transfer to Toilet: with modified independence;ambulating Pt Will Perform Tub/Shower Transfer: with modified independence;shower seat  OT  Frequency: Min 1X/week   Barriers to D/C:            Co-evaluation              AM-PAC PT "6 Clicks" Daily Activity     Outcome Measure Help from another person eating meals?: None Help from another person taking care of personal grooming?: A Little Help from another person toileting, which includes using toliet, bedpan, or urinal?: A Little Help from another person bathing (including washing, rinsing, drying)?: A Little Help from another person to put on and taking off regular upper body clothing?: None Help from another person to put on and taking off regular lower body clothing?: None 6 Click Score: 21   End of Session Equipment Utilized During Treatment: Gait belt;Rolling walker  Activity Tolerance: Patient tolerated treatment well Patient left: in bed;with call bell/phone within reach;with family/visitor present(MD present to discuss POC)  OT Visit Diagnosis: Unsteadiness on feet (R26.81);Muscle weakness (generalized) (M62.81)                Time: 3382-5053 OT Time Calculation (min): 18 min Charges:    G-Codes:     Diona Browner OTS   07/24/2017, 5:24 PM

## 2017-07-24 NOTE — Progress Notes (Signed)
Patient has a bed assignment at [facility] H [facility] bed 36, number to call report [phone]. Reach out to case manger in ED and she attempted to get transport arranged with telephone authorization and was unable to get it done. Quote given from [facility] for $12,249.85 and can not pick up patient until tomorrow.  RN Inetta Fermo also faxed over information to [employer] as well and we are awaiting thier quote. Printed patient chart, obtained disk from radiology with images, and  EMTALA signed. Daughter has agreed to pay what is needed from the patient portion. At this point we are waiting to on transport to be arrange.

## 2017-07-24 NOTE — Plan of Care (Signed)
  Problem: Clinical Measurements: Goal: Cardiovascular complication will be avoided Outcome: Progressing   Problem: Clinical Measurements: Goal: Respiratory complications will improve Outcome: Progressing   

## 2017-07-24 NOTE — Progress Notes (Signed)
Internal Medicine Attending:   I saw and examined the patient. I reviewed the resident's note and I agree with the resident's findings and plan as documented in the resident's note.  Hospital day #2 for rule out of esophageal perforation. Greatly appreciate cardiothoracic surgery to help [place] better understand the complex anatomy following her two hiatal surgery repairs. At this point, she has no signs of systemic infection or inflammation and I think we can safely hold further antibiotics. The story seems most consistent with return of the symptoms that she experiences with the hiatal hernia, most like the hernia pouch has recurred despite the surgical intervention. We will talk with general surgery to see if there is any further intervention to be done, and we need to advance her diet to see how much she is able to take in.

## 2017-07-24 NOTE — Progress Notes (Signed)
ANTICOAGULATION CONSULT NOTE Pharmacy Consult for Warfarin Indication: atrial fibrillation  Allergies  Allergen Reactions  . Codeine Nausea And Vomiting  . Dilaudid [Hydromorphone Hcl] Other (See Comments)    Pain, twitching  . Nitrofurantoin Hives, Itching and Dermatitis       . Other Itching and Rash    Antibiotic but she is not sure which one.  Maybe starts with a D for UTI  Other reaction(s): Rash Antibiotic but she is not sure which one.Maybe starts with a D for UTI     Patient Measurements: Height: 5\' 3"  (160 cm) Weight: 201 lb 15.1 oz (91.6 kg) IBW/kg (Calculated) : 52.4   Vital Signs: Temp: 99.2 F (37.3 C) (05/28 0621) Temp Source: Oral (05/28 0621) BP: 153/70 (05/28 0800) Pulse Rate: 80 (05/28 0800)  Labs: Recent Labs    07/22/17 0113 07/23/17 0246 07/24/17 0403  HGB 11.5* 12.0 11.3*  HCT 36.3 37.1 35.5*  PLT 498* 504* 516*  LABPROT 22.5*  --  26.3*  INR 2.00  --  2.44  CREATININE 0.80 0.77 0.87    Estimated Creatinine Clearance: 50.8 mL/min (by C-G formula based on SCr of 0.87 mg/dL).    Assessment: 84 yo F with hx of atrial fibrillation (Chadsvasc = 4); pharmacy consulted for warfarin.   INR therapeutic at 2.44  PTA warfarin: 3 mg daily  Goal of Therapy:  INR 2-3 Monitor platelets by anticoagulation protocol: Yes   Plan:  Repeat Warfarin 3 mg PO x 1 Daily INR, CBCs F/u s/sx bleeding, PO intake, DDI  Thank you 83, PharmD [phone] 07/24/2017,8:35 AM

## 2017-07-25 ENCOUNTER — Encounter (HOSPITAL_COMMUNITY): Payer: Self-pay

## 2017-07-25 LAB — BASIC METABOLIC PANEL
Anion gap: 12 (ref 5–15)
BUN: 7 mg/dL (ref 6–20)
CO2: 26 mmol/L (ref 22–32)
Calcium: 8.8 mg/dL — ABNORMAL LOW (ref 8.9–10.3)
Chloride: 101 mmol/L (ref 101–111)
Creatinine, Ser: 0.81 mg/dL (ref 0.44–1.00)
GFR calc Af Amer: >60 mL/min (ref >60)
GFR calc non Af Amer: >60 mL/min (ref >60)
Glucose, Bld: 100 mg/dL — ABNORMAL HIGH (ref 65–99)
Potassium: 3.5 mmol/L (ref 3.5–5.1)
Sodium: 139 mmol/L (ref 135–145)

## 2017-07-25 LAB — PROTIME-INR
INR: 1.8
Prothrombin Time: 20.7 seconds — ABNORMAL HIGH (ref 11.4–15.2)

## 2017-07-25 LAB — GLUCOSE, CAPILLARY
Glucose-Capillary: 99 mg/dL (ref 65–99)
Glucose-Capillary: 97 mg/dL (ref 65–99)

## 2017-07-25 LAB — MAGNESIUM: Magnesium: 1.9 mg/dL (ref 1.7–2.4)

## 2017-07-25 MED ORDER — LORAZEPAM 2 MG/ML IJ SOLN
0.5000 mg | Freq: Once | INTRAMUSCULAR | Status: DC | PRN
Start: 2017-07-25 — End: 2017-07-25

## 2017-07-25 NOTE — Progress Notes (Signed)
[facility] to make them aware that transport has been arranged for 3p and to hold the bed.

## 2017-07-25 NOTE — Care Management Note (Signed)
Case Management Note  Patient Details  Name: Michelle Douglas MRN: [id number] Date of Birth: [birth year]  Subjective/Objective:          Presents with abdominal pain, hx of paroxysmal atrial fibrillation on warfarin, obstructive sleep apnea, type 2 DM, HLD, hypertension, rheumatoid arthritis, hiatal hernia, restless leg syndrome, and depression. 07/05/2017 s/p laparoscopic repair of recurrent paraesophageal hernia and laparoscopic takedown of previous fundoplication at [facility].           Royce Macadamia (Daughter)     [phone]      PCP: Ardyth Gal  Action/Plan: Transition to [facility] via [facility].  Expected Discharge Date:  07/24/17               Expected Discharge Plan:  Acute to Acute Transfer  In-House Referral:  Clinical Social Work  Discharge planning Services  CM Consult  Post Acute Care Choice:    Choice offered to:     DME Arranged:   N/A DME Agency:   N/A  HH Arranged:   N/A HH Agency:   N/A  Status of Service:  Completed, signed off  If discussed at Long Length of Stay Meetings, dates discussed:    Additional Comments:  Epifanio Lesches, RN 07/25/2017, 4:23 PM

## 2017-07-25 NOTE — Progress Notes (Signed)
Pt discharged to [facility] via [facility] report given to flight nurse.

## 2017-07-25 NOTE — Progress Notes (Signed)
Consult for prayer. Patient was not feeling well so I made it very brief.  Had prayer with patient. Phebe Colla, Chaplain    07/25/17 1000  Clinical Encounter Type  Visited With Patient  Visit Type Initial;Spiritual support  Referral From Patient  Consult/Referral To Chaplain  Spiritual Encounters  Spiritual Needs Prayer;Emotional  Stress Factors  Patient Stress Factors None identified  Family Stress Factors None identified

## 2017-07-25 NOTE — Progress Notes (Signed)
NCM faxed pt's insurance card and face sheet to [facility] which was requested per rep. Gabe. Gae Gallop RN,BSN,CM

## 2017-07-25 NOTE — Progress Notes (Signed)
   Subjective:  Transfer to [facility] underway. She will be getting transported around 3 pm by ambulance [place]. She continues to have lower chest pain/odynophagia, which is tolerable after receiving morphine.   Objective:  Vital signs in last 24 hours: Vitals:   07/24/17 1332 07/24/17 2159 07/25/17 0410 07/25/17 0500  BP: (!) 145/61 (!) 151/58 (!) 137/57   Pulse: 90 64 63   Resp: 19 18 18    Temp: 98.2 F (36.8 C) 98.9 F (37.2 C) 98.9 F (37.2 C)   TempSrc: Oral Oral Oral   SpO2: 96% 95% 96%   Weight:    201 lb 15.1 oz (91.6 kg)  Height:       General: Sitting on side of bed comfortably, NAD HEENT: Michelle Douglas, EOMI Cardiac: RRR, No R/M/G appreciated Pulm: normal effort, CTAB Ext: extremities well perfused, no peripheral edema Neuro: alert and oriented X3, cranial nerves II-XII grossly intact   Assessment/Plan:  Principal Problem:   Paraesophageal hiatal hernia Active Problems:   Atrial fibrillation (HCC)   Diabetes mellitus, type 2 (HCC)   Essential hypertension  Recurrent paraesophageal hiatal hernia s/p recent laparoscopic repair  Patient hemodynamically stable and afebrile. She is clinically stable for transfer at this time.  -NPO -Pain control with morphine -Zofran PRN for nausea -LR 75 cc/hr  Hypokalemia Resolved.  -Replacing as needed   PAF Currently in NSR and has remained in NSR throughout the duration of her hospitalization without rate control as she has been NPO.  -Cardiac monitoring -NPO  Chronic Diastolic Congestive Heart Failure  Last echo 10/2016 demonstrated normal systolic function with estimated ejection fraction in the range of 60% to 65 with grade 2 diastolic dysfunction. Appears euvolemic on examination. -I/Os -Will monitor volume status closely  -Will continue LR 75 cc/hr  Type 2 DM  Hemoglobin A1C 5.7. Currently meeting inpatient goals.  -CBG monitoring  HTN Mildly hypertensive, BP 137/57.    Code Status:  DNR, confirmed on admission  DVT ppx: SCDs   Dispo: Anticipated transfer to [facility] today.   [facility], MD 07/25/2017, 11:00 AM Pager: [phone]

## 2017-07-25 NOTE — Progress Notes (Signed)
Report called to Danielle at [facility]

## 2019-10-13 IMAGING — CR DG CHEST 1V
1 series · 1 of 1 positions shown · non-contrast
Comparison: Radiographs July 21, 2017.

ADDENDUM:
Upon review of esophagram performed today as well, there does not
appear to be hiatal hernia present currently. The soft tissue
density seen behind the heart may be related to large perforation
noted on the esophagram.
CLINICAL DATA: Vomiting.

EXAM:
CHEST  1 VIEW

[chest ap]
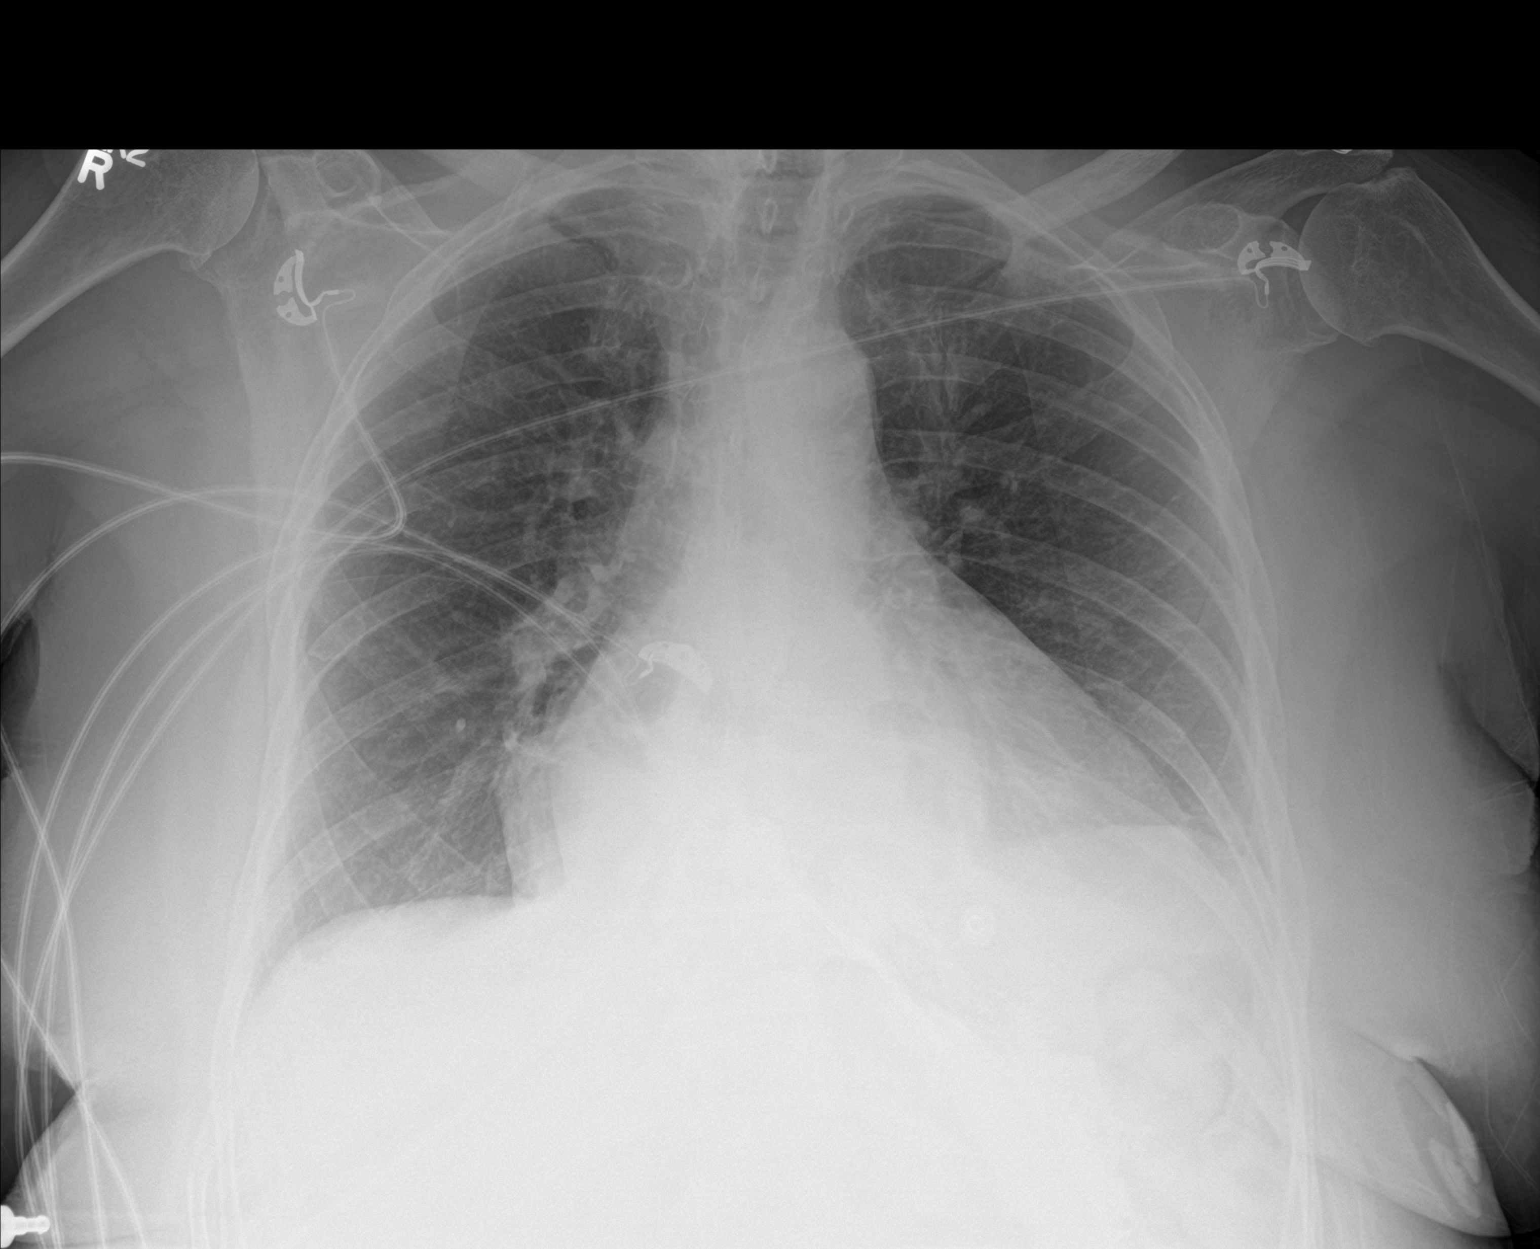

[1 of 1 positions shown; findings below may reference images not displayed]

FINDINGS: Stable cardiomegaly. No pneumothorax or pleural effusion is noted.
Stable large hiatal hernia is noted. No acute pulmonary disease is
noted. Bony thorax is unremarkable.
IMPRESSION: Stable large hiatal hernia. No acute cardiopulmonary abnormality
seen.

## 2019-10-13 IMAGING — CT CT ABD-PELV W/ CM
2 of 5 series · 16 of 46 positions shown, 18 images · IV contrast (APPLIED)
Comparison: CT 12/27/2016, 11/28/2016, 09/29/2016

CLINICAL DATA: Recent hiatal hernia revision, nausea and vomiting

EXAM:
CT ABDOMEN AND PELVIS WITH CONTRAST
TECHNIQUE: Multidetector CT imaging of the abdomen and pelvis was performed
using the standard protocol following bolus administration of
intravenous contrast.
CONTRAST:  100mL OMNIPAQUE IOHEXOL 300 MG/ML  SOLN

[Series 3: abd/ pelvis 5.0 i30f 2 · axial · 0.98mm/px · z∈[+605,+1015]mm · 13 of 92 slices shown, 15 images]
[im 5/92  soft-tissue]
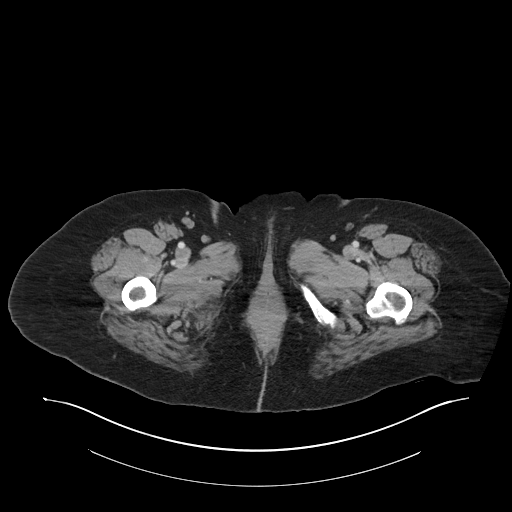
[im 5/92  bone]
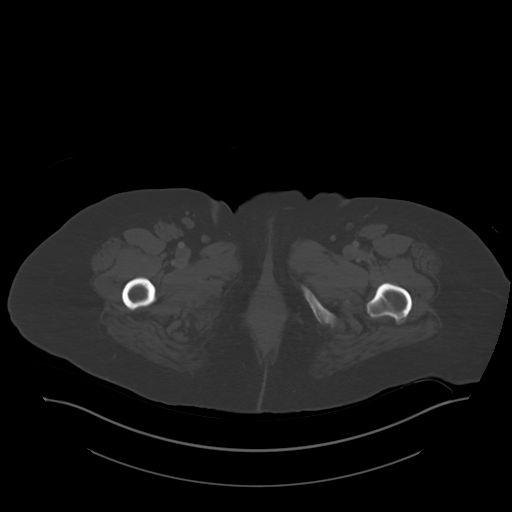
[im 15/92  soft-tissue]
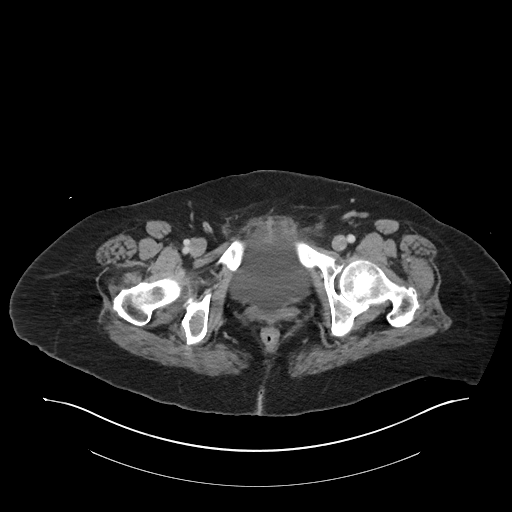
[im 20/92  soft-tissue]
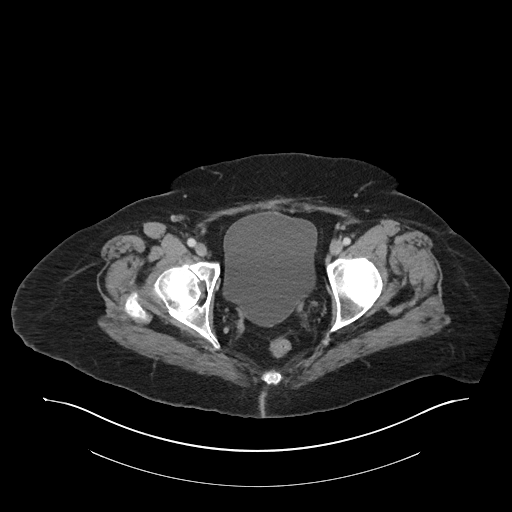
[im 24/92  soft-tissue]
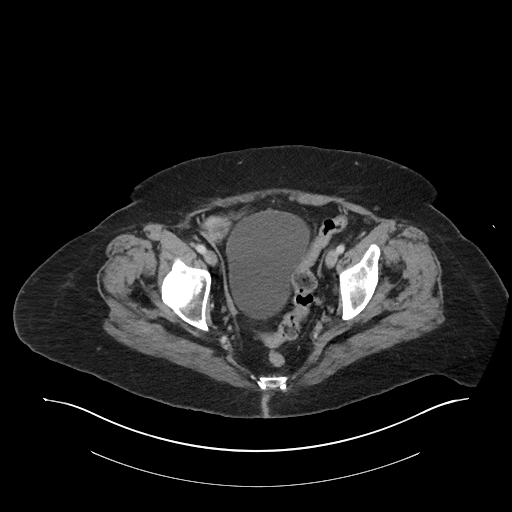
[im 34/92  soft-tissue]
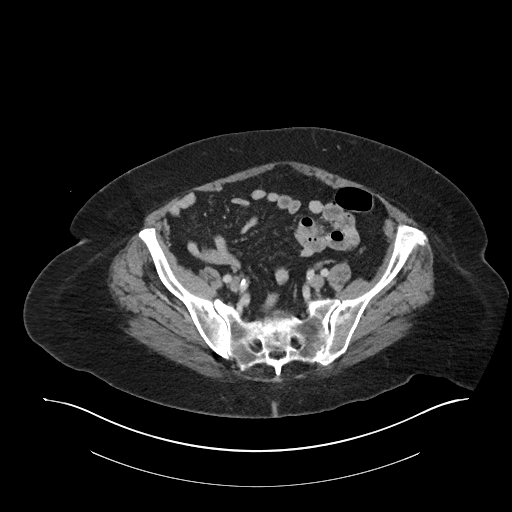
[im 39/92  soft-tissue]
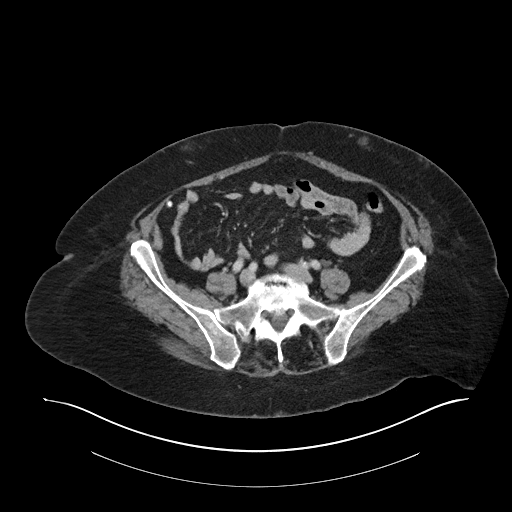
[im 48/92  soft-tissue]
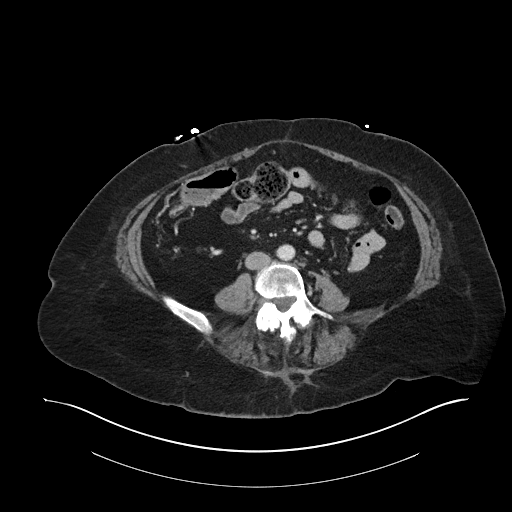
[im 53/92  soft-tissue]
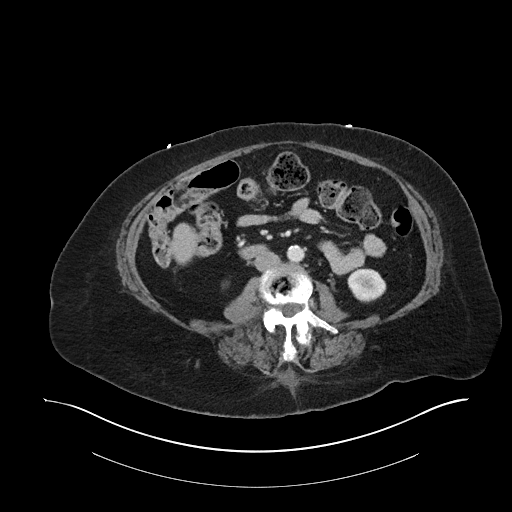
[im 58/92  soft-tissue]
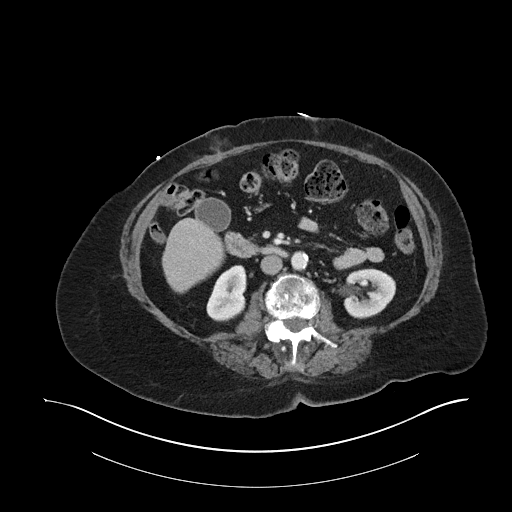
[im 58/92  bone]
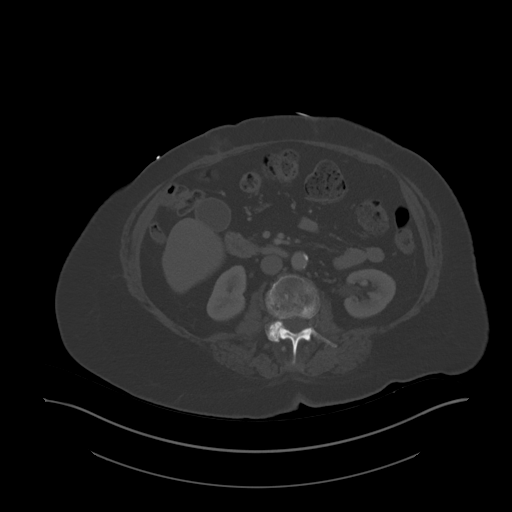
[im 68/92  soft-tissue]
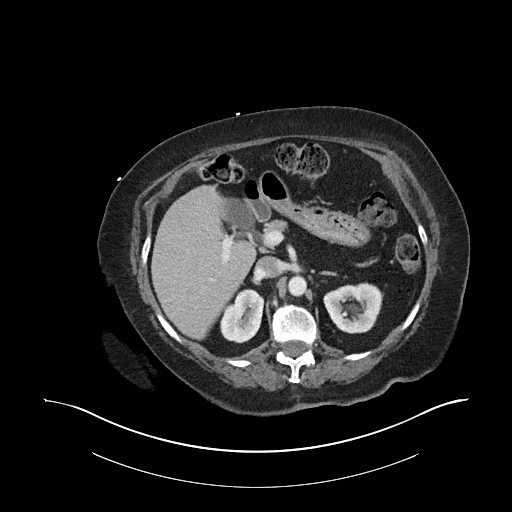
[im 72/92  soft-tissue]
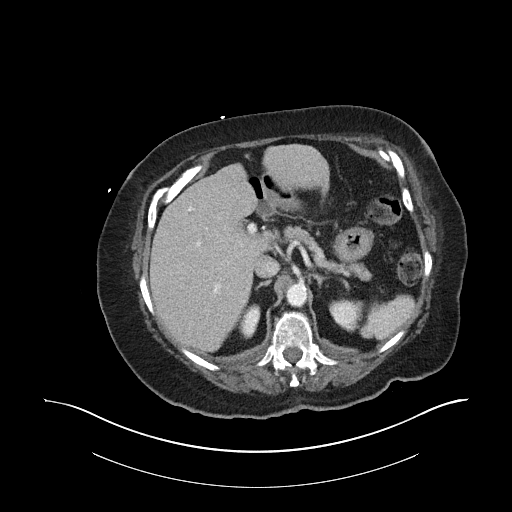
[im 77/92  soft-tissue]
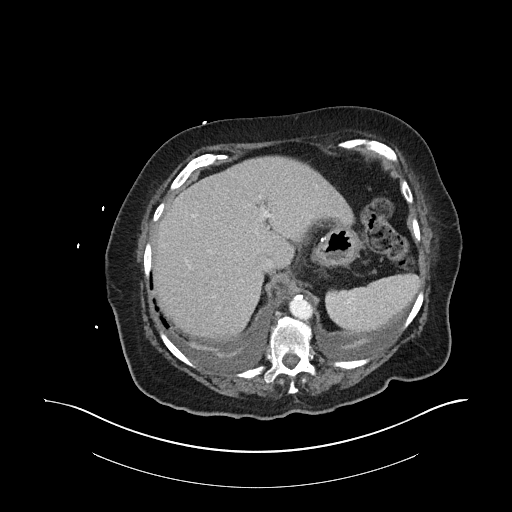
[im 87/92  soft-tissue]
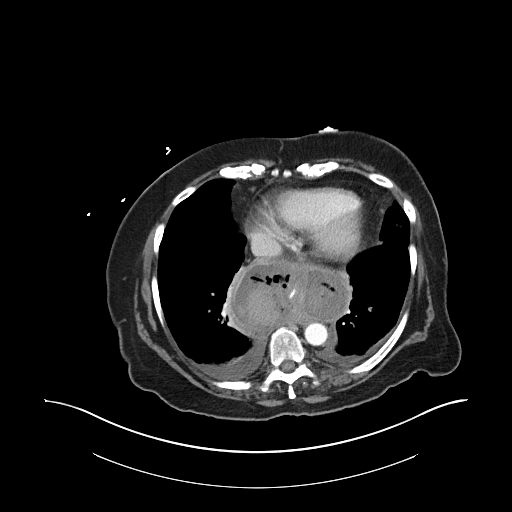

[Series 6: coronal soft tissue · coronal · 0.96mm/px · 3 of 106 slices shown]
[im 36/106  soft-tissue]
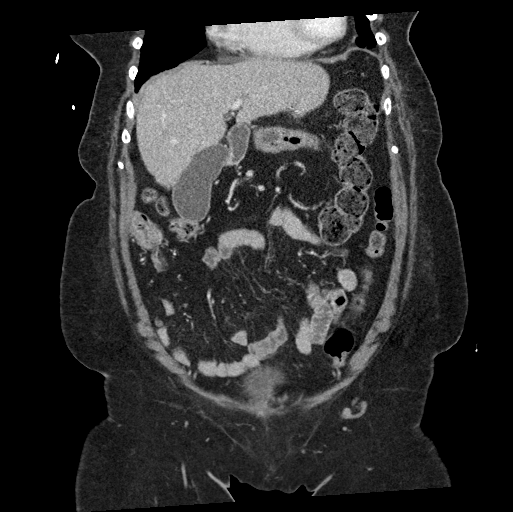
[im 47/106  soft-tissue]
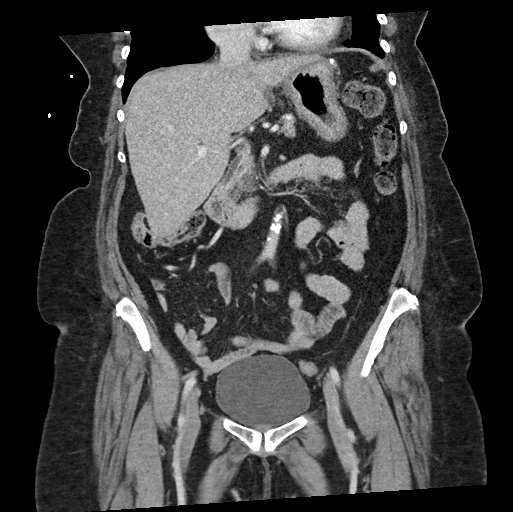
[im 59/106  soft-tissue]
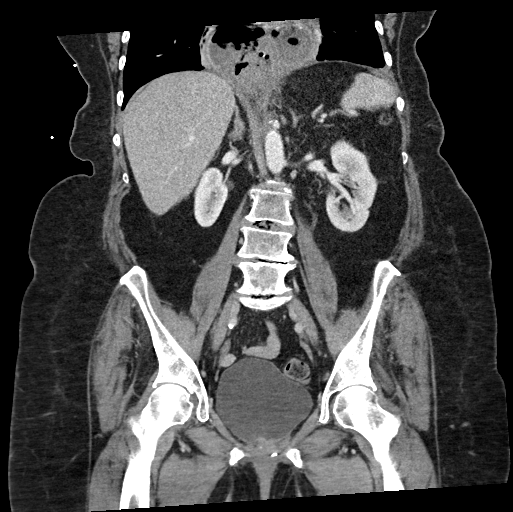

[16 of 46 positions shown; findings below may reference images not displayed]

FINDINGS: Lower chest: Small pleural effusions and passive atelectasis at the
bases. Cardiomegaly. Moderate large hiatal hernia, with new surgical
sutures within the herniated portion of the stomach as well as along
the portion of stomach within the abdominal cavity. Abnormal fluid
and edema within the hernia sac. Herniated stomach appears
thickened. No extraluminal gas.

Hepatobiliary: No focal liver abnormality is seen. No gallstones,
gallbladder wall thickening, or biliary dilatation.

Pancreas: Unremarkable. No pancreatic ductal dilatation or
surrounding inflammatory changes.

Spleen: Normal in size without focal abnormality.

Adrenals/Urinary Tract: Adrenal glands are unremarkable. Kidneys are
normal, without renal calculi, focal lesion, or hydronephrosis.
Bladder is unremarkable.

Stomach/Bowel: Stomach is within normal limits. Appendix appears
normal. No evidence of bowel wall thickening, distention, or
inflammatory changes.

Vascular/Lymphatic: Nonaneurysmal aorta. Mild to moderate aortic
atherosclerosis. No significantly enlarged lymph nodes

Reproductive: Status post hysterectomy. No adnexal masses.

Other: Negative for free air or free fluid. Small fat in the
umbilical region. Tiny subcutaneous nodules, some of which are
decrease.

Musculoskeletal: Trace retrolisthesis of L5 on S1 with trace
anterolisthesis of L4 on L5. Trace retrolisthesis L2 on L3.
Degenerative changes. No acute or suspicious abnormality.
IMPRESSION: 1. Residual moderate to large hiatal hernia, now with new surgical
sutures at the herniated stomach and intra-abdominal portion of the
stomach. Abnormal fluid and edema within the hernia sac with
suspected gastric wall thickening of the herniated stomach. No lower
mediastinal air
2. Small pleural effusions and passive atelectasis at the bases
3. There are otherwise no additional acute intra-abdominal or pelvic
abnormality seen.
